# Patient Record
Sex: Female | Born: 1945 | Race: White | Hispanic: No | Marital: Married | Smoking: Never smoker
Health system: Southern US, Community
[De-identification: ages and names within clinical notes are randomized; demographics above are authoritative.]

## PROBLEM LIST (undated history)

## (undated) DIAGNOSIS — M797 Fibromyalgia: Secondary | ICD-10-CM

## (undated) DIAGNOSIS — I7300 Raynaud's syndrome without gangrene: Secondary | ICD-10-CM

## (undated) DIAGNOSIS — M4696 Unspecified inflammatory spondylopathy, lumbar region: Secondary | ICD-10-CM

## (undated) DIAGNOSIS — F419 Anxiety disorder, unspecified: Secondary | ICD-10-CM

## (undated) DIAGNOSIS — M810 Age-related osteoporosis without current pathological fracture: Secondary | ICD-10-CM

## (undated) DIAGNOSIS — M359 Systemic involvement of connective tissue, unspecified: Secondary | ICD-10-CM

## (undated) DIAGNOSIS — K219 Gastro-esophageal reflux disease without esophagitis: Secondary | ICD-10-CM

## (undated) DIAGNOSIS — Z5189 Encounter for other specified aftercare: Secondary | ICD-10-CM

## (undated) DIAGNOSIS — T7840XA Allergy, unspecified, initial encounter: Secondary | ICD-10-CM

## (undated) DIAGNOSIS — S0300XA Dislocation of jaw, unspecified side, initial encounter: Secondary | ICD-10-CM

## (undated) DIAGNOSIS — M3500 Sicca syndrome, unspecified: Secondary | ICD-10-CM

## (undated) DIAGNOSIS — I10 Essential (primary) hypertension: Secondary | ICD-10-CM

## (undated) DIAGNOSIS — M1990 Unspecified osteoarthritis, unspecified site: Secondary | ICD-10-CM

## (undated) HISTORY — DX: Unspecified inflammatory spondylopathy, lumbar region: M46.96

## (undated) HISTORY — DX: Encounter for other specified aftercare: Z51.89

## (undated) HISTORY — PX: CARPAL TUNNEL RELEASE: SHX101

## (undated) HISTORY — PX: BURCH PROCEDURE: SHX1273

## (undated) HISTORY — DX: Sjogren syndrome, unspecified: M35.00

## (undated) HISTORY — DX: Unspecified osteoarthritis, unspecified site: M19.90

## (undated) HISTORY — DX: Gastro-esophageal reflux disease without esophagitis: K21.9

## (undated) HISTORY — DX: Essential (primary) hypertension: I10

## (undated) HISTORY — DX: Age-related osteoporosis without current pathological fracture: M81.0

## (undated) HISTORY — PX: BLADDER SURGERY: SHX569

## (undated) HISTORY — PX: JOINT REPLACEMENT: SHX530

## (undated) HISTORY — PX: COLON SURGERY: SHX602

## (undated) HISTORY — DX: Raynaud's syndrome without gangrene: I73.00

## (undated) HISTORY — DX: Systemic involvement of connective tissue, unspecified: M35.9

## (undated) HISTORY — DX: Dislocation of jaw, unspecified side, initial encounter: S03.00XA

## (undated) HISTORY — DX: Anxiety disorder, unspecified: F41.9

## (undated) HISTORY — PX: APPENDECTOMY: SHX54

## (undated) HISTORY — PX: ABDOMINAL HYSTERECTOMY: SHX81

## (undated) HISTORY — DX: Fibromyalgia: M79.7

## (undated) HISTORY — DX: Allergy, unspecified, initial encounter: T78.40XA

## (undated) HISTORY — PX: EYE SURGERY: SHX253

---

## 2016-07-05 HISTORY — PX: BELPHAROPTOSIS REPAIR: SHX369

## 2017-01-07 DIAGNOSIS — M8580 Other specified disorders of bone density and structure, unspecified site: Secondary | ICD-10-CM | POA: Insufficient documentation

## 2017-06-26 DIAGNOSIS — M3500 Sicca syndrome, unspecified: Secondary | ICD-10-CM | POA: Insufficient documentation

## 2017-06-26 HISTORY — DX: Sjogren syndrome, unspecified: M35.00

## 2017-10-07 DIAGNOSIS — M159 Polyosteoarthritis, unspecified: Secondary | ICD-10-CM

## 2017-10-07 HISTORY — DX: Polyosteoarthritis, unspecified: M15.9

## 2020-09-30 DIAGNOSIS — F5104 Psychophysiologic insomnia: Secondary | ICD-10-CM | POA: Insufficient documentation

## 2021-01-25 DIAGNOSIS — K760 Fatty (change of) liver, not elsewhere classified: Secondary | ICD-10-CM | POA: Insufficient documentation

## 2021-10-04 HISTORY — PX: COLON RESECTION: SHX5231

## 2021-10-10 LAB — HM COLONOSCOPY

## 2022-03-15 ENCOUNTER — Other Ambulatory Visit: Payer: Self-pay

## 2022-03-15 DIAGNOSIS — R059 Cough, unspecified: Secondary | ICD-10-CM | POA: Diagnosis present

## 2022-03-15 DIAGNOSIS — I10 Essential (primary) hypertension: Secondary | ICD-10-CM | POA: Insufficient documentation

## 2022-03-15 DIAGNOSIS — R7401 Elevation of levels of liver transaminase levels: Secondary | ICD-10-CM | POA: Insufficient documentation

## 2022-03-15 DIAGNOSIS — J208 Acute bronchitis due to other specified organisms: Secondary | ICD-10-CM | POA: Insufficient documentation

## 2022-03-15 DIAGNOSIS — R630 Anorexia: Secondary | ICD-10-CM | POA: Insufficient documentation

## 2022-03-15 DIAGNOSIS — E875 Hyperkalemia: Secondary | ICD-10-CM | POA: Insufficient documentation

## 2022-03-15 LAB — COMPREHENSIVE METABOLIC PANEL
ALT: 15 U/L (ref 0–44)
AST: 45 U/L — ABNORMAL HIGH (ref 15–41)
Albumin: 3.8 g/dL (ref 3.5–5.0)
Alkaline Phosphatase: 55 U/L (ref 38–126)
Anion gap: 12 (ref 5–15)
CO2: 21 mmol/L — ABNORMAL LOW (ref 22–32)
Calcium: 9.3 mg/dL (ref 8.9–10.3)
Chloride: 104 mmol/L (ref 98–111)
Creatinine, Ser: UNDETERMINED mg/dL (ref 0.44–1.00)
Glucose, Bld: 117 mg/dL — ABNORMAL HIGH (ref 70–99)
Potassium: 5.5 mmol/L — ABNORMAL HIGH (ref 3.5–5.1)
Sodium: 137 mmol/L (ref 135–145)
Total Bilirubin: 2 mg/dL — ABNORMAL HIGH (ref 0.3–1.2)
Total Protein: 7 g/dL (ref 6.5–8.1)

## 2022-03-15 NOTE — ED Triage Notes (Addendum)
Pt states was sent by doctor for low calcium in her blood work. Pt states has been having back spasms. Pt states was seen at [facility] today and had  blood work for evaluation of bronchitis.  ?

## 2022-03-16 ENCOUNTER — Emergency Department: Payer: Medicare Other

## 2022-03-16 ENCOUNTER — Emergency Department
Admission: EM | Admit: 2022-03-16 | Discharge: 2022-03-16 | Disposition: A | Discharge status: Home / Self Care | Payer: Medicare Other | Attending: Emergency Medicine | Admitting: Emergency Medicine

## 2022-03-16 ENCOUNTER — Encounter: Payer: Self-pay | Admitting: Emergency Medicine

## 2022-03-16 DIAGNOSIS — J208 Acute bronchitis due to other specified organisms: Secondary | ICD-10-CM

## 2022-03-16 LAB — BASIC METABOLIC PANEL
Anion gap: 8 (ref 5–15)
BUN: 20 mg/dL (ref 8–23)
CO2: 27 mmol/L (ref 22–32)
Calcium: 9.6 mg/dL (ref 8.9–10.3)
Chloride: 103 mmol/L (ref 98–111)
Creatinine, Ser: 0.77 mg/dL (ref 0.44–1.00)
GFR, Estimated: >60 mL/min (ref >60)
Glucose, Bld: 89 mg/dL (ref 70–99)
Potassium: 4.1 mmol/L (ref 3.5–5.1)
Sodium: 138 mmol/L (ref 135–145)

## 2022-03-16 LAB — MAGNESIUM: Magnesium: 2.4 mg/dL (ref 1.7–2.4)

## 2022-03-16 MED ORDER — HYDROCOD POLI-CHLORPHE POLI ER 10-8 MG/5ML PO SUER
5.0000 mL | Freq: Once | ORAL | Status: AC
  Administered 2022-03-16: 5 mL via ORAL
  Filled 2022-03-16: qty 5

## 2022-03-16 MED ORDER — HYDROCOD POLI-CHLORPHE POLI ER 10-8 MG/5ML PO SUER: 5.0000 mL | Freq: Two times a day (BID) | ORAL | 0 refills | Status: DC | PRN

## 2022-03-16 NOTE — Discharge Instructions (Addendum)
Please continue taking the medications you are on.  Your cough should improve after a few more days.  Your labs were all very reassuring tonight.  Continue to drink plenty of fluids and stay hydrated as you are getting over your acute illness.  Return to the emergency department if you develop new or worsening symptoms that concern you. ?

## 2022-03-16 NOTE — ED Provider Notes (Signed)
? ?[facility] ?Provider Note ? ? ? Event Date/Time  ? First MD Initiated Contact with Patient 03/16/22 0010   ?  (approximate) ? ? ?History  ? ?low calcium ? ? ?HPI ? ?Dalinda Heidt is a 76 y.o. female with no contributory past medical history who presents for evaluation of abnormal labs.  For several days she has been dealing with increased cough and some shortness of breath particular with exertion.  She had a telemedicine visit with an NP who ordered some labs and started her on a Z-Pak, prednisone, and cough medicine.  However the lab results were notable for a calcium of 8.3.  The provider was concerned because the patient also reported that she has been having some cramps in her back, so combined with the slightly lower than calcium level, she recommended she go to the emergency department for additional evaluation. ? ?The patient is in no distress at this time.  She has a frequent cough but is no worse than she was a couple of days ago when she started the medications.  No recent fever.  No chest pain.  No nausea nor vomiting.  Slightly decreased oral intake compared to normal. ?  ? ? ?Physical Exam  ? ?Triage Vital Signs: ?ED Triage Vitals [03/15/22 2147]  ?Enc Vitals Group  ?   BP (!) 147/83  ?   Pulse Rate 74  ?   Resp 16  ?   Temp 98 ?F (36.7 ?C)  ?   Temp Source Oral  ?   SpO2 93 %  ?   Weight 63.5 kg (140 lb)  ?   Height 1.549 m ('5\' 1"'$ )  ?   Head Circumference   ?   Peak Flow   ?   Pain Score 0  ?   Pain Loc   ?   Pain Edu?   ?   Excl. in [place]?   ? ? ?Most recent vital signs: ?Vitals:  ? 03/15/22 2147 03/16/22 0100  ?BP: (!) 147/83 (!) 158/76  ?Pulse: 74 (!) 59  ?Resp: 16 16  ?Temp: 98 ?F (36.7 ?C)   ?SpO2: 93% 98%  ? ? ? ?General: Awake, no distress.  Good spirits, alert and oriented x3, no acute distress. ?CV:  Good peripheral perfusion.  Normal heart sounds. ?Resp:  Normal effort.  Lungs are clear to auscultation though she does have occasional tight sounding cough.  No coarse  breath sounds in the lung bases. ?Abd:  No distention.  ? ? ?ED Results / Procedures / Treatments  ? ?Labs ?(all labs ordered are listed, but only abnormal results are displayed) ?Labs Reviewed  ?COMPREHENSIVE METABOLIC PANEL - Abnormal; Notable for the following components:  ?    Result Value  ? Potassium 5.5 (*)   ? CO2 21 (*)   ? Glucose, Bld 117 (*)   ? AST 45 (*)   ? Total Bilirubin 2.0 (*)   ? All other components within normal limits  ?BASIC METABOLIC PANEL  ?MAGNESIUM  ? ? ? ? ? ?RADIOLOGY ?I personally reviewed and interpreted the patient's two-view chest x-ray.  No evidence of pneumonia nor other acute abnormality. ? ? ? ?PROCEDURES: ? ?Critical Care performed: No ? ?Procedures ? ? ?MEDICATIONS ORDERED IN ED: ?Medications  ?chlorpheniramine-HYDROcodone 10-8 MG/5ML suspension 5 mL (5 mLs Oral Given 03/16/22 0101)  ? ? ? ?IMPRESSION / MDM / ASSESSMENT AND PLAN / ED COURSE  ?I reviewed the triage vital signs and the nursing notes. ?             ?               ? ?  Differential diagnosis includes, but is not limited to, metabolic or electrolyte abnormality, symptomatic hypocalcemia, mild dehydration, community-acquired pneumonia, viral bronchitis. ? ?Vital signs interpreted and are reassuring other than very mild hypertension.  Physical exam is reassuring; other than frequent cough she has no concerning abnormalities.  She has a printout with her outpatient lab work and I reviewed and interpreted the results.  The low counts and that the outpatient provider was worried about was a calcium of 8.3.  This is unlikely to result in muscle cramps and unlikely to require IV treatment. ? ?However labs ordered during this visit include a CMP.  I interpreted the results and her potassium was 5.5 but I suspect that the labs may not be accurate because there was an insufficient quantity to calculated BUN and creatinine.  Her AST was slightly elevated at 45 and her total bilirubin is slightly elevated at 2.0, but she is  having no abdominal pain or tenderness nor other GI symptoms. ? ?I talked with the patient, who is in no distress, and we agreed that I would order a two-view chest x-ray and have an IV placed to order another metabolic panel, as well as a magnesium level.  This should allow me to better assess the patient's electrolyte or metabolic abnormalities at this time, but anticipate she will be treatable as an outpatient.  She understands and agrees with the plan. ? ? ? ? ?Clinical Course as of 03/16/22 0202  ?Sat Mar 16, 2022  ?6967 Basic metabolic panel ?I interpreted the results of the repeat BMP, and they are all within normal limits including her calcium and her potassium.  I personally viewed and interpreted her two-view chest x-ray and I see no evidence of pneumonia. ? ?I updated the patient with the results and she is very pleased.  She agrees with the plan for discharge and outpatient follow-up as needed.  I gave my usual and customary return precautions.  There is no evidence she would benefit from inpatient hospitalization. [CF]  ?  ?Clinical Course User Index ?[CF] Hinda Kehr, MD  ? ? ? ?FINAL CLINICAL IMPRESSION(S) / ED DIAGNOSES  ? ?Final diagnoses:  ?Viral bronchitis  ? ? ? ?Rx / DC Orders  ? ?ED Discharge Orders   ? ? None  ? ?  ? ? ? ?Note:  This document was prepared using Dragon voice recognition software and may include unintentional dictation errors. ?  ?Hinda Kehr, MD ?03/16/22 0202 ? ?

## 2022-03-27 ENCOUNTER — Ambulatory Visit: Payer: Medicare Other | Admitting: Family Medicine

## 2022-03-27 ENCOUNTER — Encounter: Payer: Self-pay | Admitting: Family Medicine

## 2022-03-27 VITALS — BP 122/63 | HR 77 | Temp 98.6°F | Resp 16 | Ht 61.0 in | Wt 140.6 lb

## 2022-03-27 DIAGNOSIS — J301 Allergic rhinitis due to pollen: Secondary | ICD-10-CM | POA: Diagnosis not present

## 2022-03-27 DIAGNOSIS — R0609 Other forms of dyspnea: Secondary | ICD-10-CM | POA: Diagnosis not present

## 2022-03-27 DIAGNOSIS — J208 Acute bronchitis due to other specified organisms: Secondary | ICD-10-CM | POA: Insufficient documentation

## 2022-03-27 MED ORDER — ALBUTEROL SULFATE HFA 108 (90 BASE) MCG/ACT IN AERS: 2.0000 | INHALATION_SPRAY | Freq: Four times a day (QID) | RESPIRATORY_TRACT | 11 refills | Status: DC | PRN

## 2022-03-27 MED ORDER — OPTICHAMBER DIAMOND MISC: 0 refills | Status: DC

## 2022-03-27 MED ORDER — CETIRIZINE HCL 10 MG PO TABS: 10.0000 mg | ORAL_TABLET | Freq: Every day | ORAL | 11 refills | Status: DC

## 2022-03-27 NOTE — Progress Notes (Signed)
I,Sulibeya S Dimas,acting as a Education administrator for Gwyneth Sprout, FNP.,have documented all relevant documentation on the behalf of Gwyneth Sprout, FNP,as directed by  Gwyneth Sprout, FNP while in the presence of Gwyneth Sprout, FNP.  New patient visit   Patient: Grace Wheeler   DOB: [birth year]   76 y.o. Female  MRN: [id number] Visit Date: 03/27/2022  Today's healthcare provider: Gwyneth Sprout, FNP  Patient presents for new patient visit to establish care.  Introduced to Designer, jewellery role and practice setting.  All questions answered.  Discussed provider/patient relationship and expectations.   Chief Complaint  Patient presents with   New Patient (Initial Visit)   Subjective    Grace Wheeler is a 76 y.o. female who presents today as a new patient to establish care.  HPI  Patient recently moved from [place]. Patient reports she is wanting to go over her recent visit from 03/16/22. Patient was sent to ER due to abnormal lab results and URI. Patient reports persistent cough and some shortness of breath.  Past Medical History:  Diagnosis Date   Arthritis    Autoimmune disease ([place])    multiple autoimmune system problems   Blood transfusion without reported diagnosis    Fibromyalgia    GERD (gastroesophageal reflux disease)    Hypertension    Lumbar spondylitis (HCC)    Raynaud's disease    Sjogren's disease ([place])    TMJ (dislocation of temporomandibular joint)    Past Surgical History:  Procedure Laterality Date   ABDOMINAL HYSTERECTOMY     BLADDER SURGERY     BURCH PROCEDURE     CARPAL TUNNEL RELEASE Bilateral    No family status information on file.   History reviewed. No pertinent family history. Social History   Socioeconomic History   Marital status: Married    Spouse name: Not on file   Number of children: Not on file   Years of education: Not on file   Highest education level: Not on file  Occupational History   Not on file  Tobacco Use   Smoking status: Never    Smokeless tobacco: Never  Substance and Sexual Activity   Alcohol use: Not on file   Drug use: Not on file   Sexual activity: Not on file  Other Topics Concern   Not on file  Social History Narrative   Not on file   Social Determinants of Health   Financial Resource Strain: Not on file  Food Insecurity: Not on file  Transportation Needs: Not on file  Physical Activity: Not on file  Stress: Not on file  Social Connections: Not on file   Outpatient Medications Prior to Visit  Medication Sig   acetaminophen (TYLENOL) 650 MG CR tablet Tylenol Arthritis Pain 650 mg tablet,extended release  Take 1 tablet twice a day by oral route.   clonazePAM (KLONOPIN) 1 MG tablet clonazepam 1 mg tablet  Take 1 tablet every day by oral route at bedtime.   diclofenac Sodium (VOLTAREN) 1 % GEL diclofenac 1 % topical gel  APPLY 2 GRAM TO THE AFFECTED AREA(S) BY TOPICAL ROUTE 4 TIMES PER DAY   hydrochlorothiazide (HYDRODIURIL) 25 MG tablet hydrochlorothiazide 25 mg tablet  TAKE 1 TABLET BY MOUTH  DAILY   hydroxychloroquine (PLAQUENIL) 200 MG tablet hydroxychloroquine 200 mg tablet  TAKE 1 TABLET BY MOUTH  DAILY   Light Mineral Oil-Mineral Oil 0.5-0.5 % EMUL Retaine MGD (PF) 0.5 %-0.5 % eye drops in a dropperette  As Needed  Loteprednol Etabonate 0.5 % GEL Lotemax 0.5 % eye gel drops  Instill 1 drop every day by ophthalmic route.   metoprolol succinate (TOPROL-XL) 50 MG 24 hr tablet metoprolol succinate ER 50 mg tablet,extended release 24 hr  TAKE 1 TABLET BY MOUTH AT  BEDTIME   omeprazole (PRILOSEC) 40 MG capsule omeprazole 40 mg capsule,delayed release  TAKE 1 CAPSULE BY MOUTH  DAILY   chlorpheniramine-HYDROcodone (TUSSIONEX PENNKINETIC ER) 10-8 MG/5ML Take 5 mLs by mouth every 12 (twelve) hours as needed for cough. (Patient not taking: Reported on 03/27/2022)   No facility-administered medications prior to visit.   Allergies  Allergen Reactions   Ambien [Zolpidem]     Other reaction(s): Other    Levofloxacin     Other reaction(s): Vomiting     There is no immunization history on file for this patient.  Health Maintenance  Topic Date Due   Hepatitis C Screening  Never done   TETANUS/TDAP  Never done   COLONOSCOPY (Pts 45-69yr Insurance coverage will need to be confirmed)  Never done   MAMMOGRAM  Never done   Zoster Vaccines- Shingrix (1 of 2) Never done   Pneumonia Vaccine 74 Years old (1 - PCV) Never done   DEXA SCAN  Never done   COVID-19 Vaccine (3 - Booster for Moderna series) 03/07/2020   INFLUENZA VACCINE  06/04/2022   HPV VACCINES  Aged Out    Patient Care Team: PGwyneth Sprout FNP as PCP - General (Family Medicine)  Review of Systems  Constitutional:  Positive for diaphoresis, fatigue and fever.  HENT:  Positive for congestion and rhinorrhea.   Respiratory:  Positive for cough and wheezing.   Musculoskeletal:  Positive for arthralgias.      Objective    BP 122/63 (BP Location: Left Arm, Patient Position: Sitting, Cuff Size: Normal)   Pulse 77   Temp 98.6 F (37 C) (Oral)   Resp 16   Ht '5\' 1"'$  (1.549 m)   Wt 140 lb 9.6 oz (63.8 kg)   SpO2 99%   BMI 26.57 kg/m    Physical Exam Vitals and nursing note reviewed.  Constitutional:      General: She is not in acute distress.    Appearance: Normal appearance. She is overweight. She is not ill-appearing, toxic-appearing or diaphoretic.  HENT:     Head: Normocephalic and atraumatic.     Right Ear: Tympanic membrane, ear canal and external ear normal. Decreased hearing noted.     Left Ear: Tympanic membrane, ear canal and external ear normal. Decreased hearing noted.     Nose: Nose normal. No congestion or rhinorrhea.     Right Sinus: No maxillary sinus tenderness or frontal sinus tenderness.     Left Sinus: No maxillary sinus tenderness or frontal sinus tenderness.     Mouth/Throat:     Mouth: Mucous membranes are moist.     Pharynx: Oropharynx is clear. No oropharyngeal exudate.  Eyes:      Pupils: Pupils are equal, round, and reactive to light.  Cardiovascular:     Rate and Rhythm: Normal rate and regular rhythm.     Pulses: Normal pulses.     Heart sounds: Normal heart sounds. No murmur heard.   No friction rub. No gallop.  Pulmonary:     Effort: Pulmonary effort is normal. No respiratory distress.     Breath sounds: Normal breath sounds. No stridor. No wheezing, rhonchi or rales.  Chest:     Chest wall: No tenderness.  Abdominal:     General: Bowel sounds are normal.     Palpations: Abdomen is soft.  Musculoskeletal:        General: No swelling, tenderness, deformity or signs of injury. Normal range of motion.     Right lower leg: No edema.     Left lower leg: No edema.  Skin:    General: Skin is warm and dry.     Capillary Refill: Capillary refill takes less than 2 seconds.     Coloration: Skin is not jaundiced or pale.     Findings: No bruising, erythema, lesion or rash.  Neurological:     General: No focal deficit present.     Mental Status: She is alert and oriented to person, place, and time. Mental status is at baseline.     Cranial Nerves: No cranial nerve deficit.     Sensory: No sensory deficit.     Motor: No weakness.     Coordination: Coordination normal.  Psychiatric:        Mood and Affect: Mood normal.        Behavior: Behavior normal.        Thought Content: Thought content normal.        Judgment: Judgment normal.    Depression Screen    03/27/2022    3:13 PM  PHQ 2/9 Scores  PHQ - 2 Score 0  PHQ- 9 Score 2   No results found for any visits on 03/27/22.  Assessment & Plan      Problem List Items Addressed This Visit       Respiratory   Seasonal allergic rhinitis due to pollen    Acute, stable Recommend use of zyrtec daily at qHS to assist with new relocation to [place] Denies previous hx of seasonal allergies        Relevant Medications   cetirizine (ZYRTEC) 10 MG tablet     Other   DOE (dyspnea on exertion) - Primary     Recent hospitalization d/t viral bronchitis, continues to have occasional wheezing Lungs clear today; no rhonchi heard Denies cough Continues to not feel 100% Recommend use of inhaler for DOE  Continue to allow time for recovery        Relevant Medications   albuterol (VENTOLIN HFA) 108 (90 Base) MCG/ACT inhaler   Spacer/Aero-Holding Chambers (OPTICHAMBER DIAMOND) MISC     Return in about 3 months (around 06/27/2022) for chonic disease management- check her records at home from [facility].     I, Gwyneth Sprout, FNP, have reviewed all documentation for this visit. The documentation on 03/27/22 for the exam, diagnosis, procedures, and orders are all accurate and complete.    Gwyneth Sprout, [place] [phone] (phone) [phone] (fax)  [place]

## 2022-03-27 NOTE — Assessment & Plan Note (Signed)
Acute, stable Recommend use of zyrtec daily at qHS to assist with new relocation to [place] Denies previous hx of seasonal allergies

## 2022-03-27 NOTE — Assessment & Plan Note (Signed)
Recent hospitalization d/t viral bronchitis, continues to have occasional wheezing Lungs clear today; no rhonchi heard Denies cough Continues to not feel 100% Recommend use of inhaler for DOE  Continue to allow time for recovery

## 2022-05-27 ENCOUNTER — Telehealth: Payer: Self-pay | Admitting: Family Medicine

## 2022-05-27 NOTE — Telephone Encounter (Signed)
Copied from [place] [id number]. Topic: Medicare AWV >> May 27, 2022  3:19 PM Jae Dire wrote: Reason for CRM:  Left message for patient to call back and schedule Medicare Annual Wellness Visit (AWV) in office.   If not able to come in the office, please offer to do virtually or by telephone.   No hx of AWV - AWV-I eligible per [employer] as of 03/04/2014  Please schedule at anytime with [facility] Health Advisor.   45 minute appointment  Any questions, please contact me at [phone]

## 2022-06-14 ENCOUNTER — Telehealth: Payer: Self-pay | Admitting: Family Medicine

## 2022-06-14 DIAGNOSIS — M069 Rheumatoid arthritis, unspecified: Secondary | ICD-10-CM

## 2022-06-14 DIAGNOSIS — D229 Melanocytic nevi, unspecified: Secondary | ICD-10-CM

## 2022-06-14 NOTE — Telephone Encounter (Signed)
Patient would like to speak with someone about getting referred if necessary to other specialist with in [facility]

## 2022-06-14 NOTE — Telephone Encounter (Signed)
Spoke with patient. States she does not need a referral per her insurance, but wanted to know of rheumatologists within [facility] that she could be seen by and accepted her insurance. Phone number to an office was given to her and she said she will call them and look into it from there.

## 2022-06-17 ENCOUNTER — Other Ambulatory Visit: Payer: Self-pay | Admitting: Family Medicine

## 2022-06-17 NOTE — Telephone Encounter (Signed)
[facility] faxed refill request for the following medications:  clonazePAM (KLONOPIN) 1 MG tablet   Please advise.

## 2022-06-19 ENCOUNTER — Telehealth: Payer: Self-pay

## 2022-06-19 NOTE — Telephone Encounter (Signed)
Referrals sent

## 2022-06-19 NOTE — Telephone Encounter (Signed)
Copied from [place] [id number]. Topic: Referral - Request for Referral >> Jun 19, 2022 10:54 AM Tiffany B wrote: Patient states she has always seen a Dermatologist and now that she has relocated she would like PCP to refer her to a dermatologist due to her mother passing away from melanoma cancer, patient has several moles on her body and annual checks.   Patient would like  a referral to a Rheumatologist due to fibromyalgia, arthritis and her previous specialist gave her her frequent cortisone injection.  Patient would like a follow up call when referrals have been placed

## 2022-06-26 ENCOUNTER — Telehealth: Payer: Self-pay

## 2022-06-26 NOTE — Telephone Encounter (Signed)
Copied from [place] [id number]. Topic: General - Other >> Jun 26, 2022  9:09 AM Cyndi Bender wrote: Reason for CRM: Pt reports that she saw 2 Rheumatologist and provided the following: Dr. Darcey Nora at [facility] ph# [phone] and Dr. Joanne Chars at [facility] [place] ph# [phone]

## 2022-07-01 ENCOUNTER — Telehealth: Payer: Self-pay

## 2022-07-01 ENCOUNTER — Other Ambulatory Visit: Payer: Self-pay | Admitting: Family Medicine

## 2022-07-01 NOTE — Telephone Encounter (Signed)
Called patient to explain that Daneil Dan did not prescribe klonopin to her, it came from another prescriber. Not sure what is needed from [place]. Okay for [facility] to relay message.

## 2022-07-01 NOTE — Telephone Encounter (Addendum)
Patient established care with PCP on 03/27/2022 and stated all her records should have been received. Patient is unclear why PCP can not renew her medications.   Patient states she brought in a list of long term medication. Patient states if another appointment is needed to refill medication please advise because she will run out of her  clonazePAM (KLONOPIN) 1 MG tablet.    [facility] ([place] [place] Phone:  [phone]  Fax:  [phone]

## 2022-07-01 NOTE — Telephone Encounter (Signed)
error 

## 2022-07-01 NOTE — Telephone Encounter (Unsigned)
Copied from [place] [id number]. Topic: General - Other >> Jul 01, 2022  9:27 AM Everette C wrote: Reason for CRM: Medication Refill - Medication: omeprazole (PRILOSEC) 40 MG capsule [[id number]] - the patient has requested a 90 day supply   Has the patient contacted their pharmacy? Yes.   (Agent: If no, request that the patient contact the pharmacy for the refill. If patient does not wish to contact the pharmacy document the reason why and proceed with request.) (Agent: If yes, when and what did the pharmacy advise?)  Preferred Pharmacy (with phone number or street name): [facility] ([place], [facility] [address] [place] [address] [place] [address] Phone: [phone] Fax: [phone] Hours: Not open 24 hours   Has the patient been seen for an appointment in the last year OR does the patient have an upcoming appointment? Yes.    Agent: Please be advised that RX refills may take up to 3 business days. We ask that you follow-up with your pharmacy.

## 2022-07-01 NOTE — Telephone Encounter (Addendum)
I scheduled this patient for tomorrow for a medication refill as she is upset she will be out of medication. She was demanding we send in her medication today to [facility] for clonazePAM (KLONOPIN) 1 MG tablet. She stated she didn't understand why we were not able to fill today. Explained to her Daneil Dan is out of the office and would be happy to make an appt on Wednesday. She declined and got very upset stating we didn't have any providers that could give her the medication she needs. I told her that is not what I said and they it is to each provider as to what they feel comfortable prescribing. She then demanded to see an MD tomorrow - so I scheduled her with Dr. Caryn Section and told her he WOULD NOT be taking over her care - and also has the right to decline the refill as we only have one office note from the previous PCP.   Pt stated "what in the hell is wrong with your office" and then be came very agitated.  She stated I called her a drug addict and if she was she would be asking for something stronger.  I told her I never called her that and we are only trying to do what is best for the patient and her health.   Appt was made.

## 2022-07-01 NOTE — Telephone Encounter (Signed)
Copied from [place] [id number]. Topic: General - Other >> Jul 01, 2022  9:26 AM Everette C wrote: Reason for CRM: The patient has been directed by pharmacy to provide their PCP with a phone number to follow up on a previous request for clonazePAM (KLONOPIN) 1 MG tablet   The patient would like for Prov. Rollene Rotunda to contact [phone] to provide additional information related to their previous request for the medication   Please contact the patient further if needed

## 2022-07-02 ENCOUNTER — Encounter: Payer: Self-pay | Admitting: Family Medicine

## 2022-07-02 ENCOUNTER — Ambulatory Visit: Payer: Medicare Other | Admitting: Family Medicine

## 2022-07-02 VITALS — BP 127/61 | HR 75 | Temp 99.2°F | Resp 16 | Wt 143.0 lb

## 2022-07-02 DIAGNOSIS — M8580 Other specified disorders of bone density and structure, unspecified site: Secondary | ICD-10-CM | POA: Insufficient documentation

## 2022-07-02 DIAGNOSIS — K219 Gastro-esophageal reflux disease without esophagitis: Secondary | ICD-10-CM | POA: Insufficient documentation

## 2022-07-02 DIAGNOSIS — G4700 Insomnia, unspecified: Secondary | ICD-10-CM | POA: Insufficient documentation

## 2022-07-02 DIAGNOSIS — I10 Essential (primary) hypertension: Secondary | ICD-10-CM | POA: Diagnosis not present

## 2022-07-02 DIAGNOSIS — I700 Atherosclerosis of aorta: Secondary | ICD-10-CM | POA: Insufficient documentation

## 2022-07-02 DIAGNOSIS — M3500 Sicca syndrome, unspecified: Secondary | ICD-10-CM | POA: Diagnosis not present

## 2022-07-02 DIAGNOSIS — I7300 Raynaud's syndrome without gangrene: Secondary | ICD-10-CM | POA: Insufficient documentation

## 2022-07-02 DIAGNOSIS — M797 Fibromyalgia: Secondary | ICD-10-CM | POA: Insufficient documentation

## 2022-07-02 DIAGNOSIS — E559 Vitamin D deficiency, unspecified: Secondary | ICD-10-CM | POA: Insufficient documentation

## 2022-07-02 MED ORDER — CLONAZEPAM 1 MG PO TABS: 1.0000 mg | ORAL_TABLET | Freq: Every day | ORAL | 1 refills | Status: DC

## 2022-07-02 MED ORDER — OMEPRAZOLE 40 MG PO CPDR: 40.0000 mg | DELAYED_RELEASE_CAPSULE | Freq: Every day | ORAL | 1 refills | Status: DC

## 2022-07-02 NOTE — Progress Notes (Signed)
I,Roshena L Chambers,acting as a scribe for Lelon Huh, MD.,have documented all relevant documentation on the behalf of Lelon Huh, MD,as directed by  Lelon Huh, MD while in the presence of Lelon Huh, MD.    Established patient visit   Patient: Grace Wheeler   DOB: 17-Jun-1946   76 y.o. Female  MRN: [id number] Visit Date: 07/02/2022  Today's healthcare provider: Lelon Huh, MD   Chief Complaint  Patient presents with   Insomnia   Gastroesophageal Reflux   Subjective    HPI  Insomnia: Patient is here today requesting a refill on Klonopin. She reports taking Klonopin for the past 3 years. This medication was previously prescribed by her former health care provider in [place], Dr. Gus Puma. Patient states she has fibromyalgia which causes her to have trouble falling asleep and staying asleep.  Patient established care with Tally Joe at the end of May and was advised to follow up for chronic problems in 3 months. Request for refill of clonazepam was faxed from [facility] on 06-17-2022 which was denied, but patient was never contacted about denial. She just found out over the weekend that refill was denied and upset that she was never contacted about denial.   GERD: Patient is requesting a refill for Omeprazole. She reports taking this medication to help with indigestion for many years and reports it continues to work well.   Medications: Outpatient Medications Prior to Visit  Medication Sig   acetaminophen (TYLENOL) 650 MG CR tablet Tylenol Arthritis Pain 650 mg tablet,extended release  Take 1 tablet twice a day by oral route.   clonazePAM (KLONOPIN) 1 MG tablet clonazepam 1 mg tablet  Take 1 tablet every day by oral route at bedtime.   diclofenac Sodium (VOLTAREN) 1 % GEL diclofenac 1 % topical gel  APPLY 2 GRAM TO THE AFFECTED AREA(S) BY TOPICAL ROUTE 4 TIMES PER DAY   hydrochlorothiazide (HYDRODIURIL) 25 MG tablet hydrochlorothiazide 25 mg tablet  TAKE 1 TABLET  BY MOUTH  DAILY   hydroxychloroquine (PLAQUENIL) 200 MG tablet hydroxychloroquine 200 mg tablet  TAKE 1 TABLET BY MOUTH  DAILY   Light Mineral Oil-Mineral Oil 0.5-0.5 % EMUL Retaine MGD (PF) 0.5 %-0.5 % eye drops in a dropperette  As Needed   metoprolol succinate (TOPROL-XL) 50 MG 24 hr tablet metoprolol succinate ER 50 mg tablet,extended release 24 hr  TAKE 1 TABLET BY MOUTH AT  BEDTIME   omeprazole (PRILOSEC) 40 MG capsule omeprazole 40 mg capsule,delayed release  TAKE 1 CAPSULE BY MOUTH  DAILY   [DISCONTINUED] albuterol (VENTOLIN HFA) 108 (90 Base) MCG/ACT inhaler Inhale 2 puffs into the lungs every 6 (six) hours as needed for wheezing or shortness of breath.   [DISCONTINUED] cetirizine (ZYRTEC) 10 MG tablet Take 1 tablet (10 mg total) by mouth at bedtime.   [DISCONTINUED] chlorpheniramine-HYDROcodone (TUSSIONEX PENNKINETIC ER) 10-8 MG/5ML Take 5 mLs by mouth every 12 (twelve) hours as needed for cough. (Patient not taking: Reported on 03/27/2022)   [DISCONTINUED] Loteprednol Etabonate 0.5 % GEL Lotemax 0.5 % eye gel drops  Instill 1 drop every day by ophthalmic route. (Patient not taking: Reported on 07/02/2022)   [DISCONTINUED] Spacer/Aero-Holding [facility] DIAMOND) MISC Use with inhaler.   No facility-administered medications prior to visit.    Review of Systems  Constitutional:  Negative for appetite change, chills, fatigue and fever.  Respiratory:  Negative for chest tightness and shortness of breath.   Cardiovascular:  Negative for chest pain and palpitations.  Gastrointestinal:  Negative  for abdominal pain, nausea and vomiting.       Indigestion  Neurological:  Negative for dizziness and weakness.  Psychiatric/Behavioral:  Positive for sleep disturbance.        Objective    BP 127/61 (BP Location: Right Arm, Patient Position: Sitting, Cuff Size: Normal)   Pulse 75   Temp 99.2 F (37.3 C) (Oral)   Resp 16   Wt 143 lb (64.9 kg)   SpO2 98% Comment: room air   BMI 27.02 kg/m    Physical Exam   General appearance: Well developed, well nourished female, cooperative and in no acute distress Head: Normocephalic, without obvious abnormality, atraumatic Respiratory: Respirations even and unlabored, normal respiratory rate Extremities: All extremities are intact.  Skin: Skin color, texture, turgor normal. No rashes seen  Psych: Appropriate mood and affect. Neurologic: Mental status: Alert, oriented to person, place, and time, thought content appropriate.    Assessment & Plan     1. Insomnia, unspecified type Reviewed prior PCP records verifying prescription for clonazepam '1mg'$  [facility] for insomnia. Reviewed PDMP verifying last refill for '1mg'$  clonazepam dispensed #90 from [facility] on 02/18/2022. She has been taking this for about 3 years and gets a good sleep through half the night. Has had adverse reactions to zolpidem and clonazepam in the past. She exhibits no signs of abuse or cognitive impairments. Counseled patient regarding abuse potential and that medication is controlled substance. Refilled #90 and 1 refill to her mail order  2. Gastroesophageal reflux disease, unspecified whether esophagitis present Controlled on omeprazole, refill #90 with 1 refill.   3. Primary hypertension Well controlled. Continue current medications.    4. Sjogren's syndrome, with unspecified organ involvement ([place])  5. Fibromyalgia  6. Aortic atherosclerosis ([place])  7. Osteopenia, unspecified location  8. Vitamin D deficiency  9. Raynaud's disease without gangrene  She requests to be have MD rather the N.P. as her PCP due to multiple complex medical conditions. Will have her follow up with Dr. Alba Cory in 3-4 months.      The entirety of the information documented in the History of Present Illness, Review of Systems and Physical Exam were personally obtained by me. Portions of this information were initially documented by the CMA and reviewed by me for  thoroughness and accuracy.     Lelon Huh, MD  [facility] [phone] (phone) [phone] (fax)  [place]

## 2022-07-02 NOTE — Telephone Encounter (Signed)
Requested medication (s) are due for refill today: unsure  Requested medication (s) are on the active medication list:yes  Last refill:  02/06/18  Future visit scheduled:yes  Notes to clinic:  Unable to refill per protocol, last refill by historical provider.  Last refill 02/06/18, unsure if pt is taking, routing for review.     Requested Prescriptions  Pending Prescriptions Disp Refills   omeprazole (PRILOSEC) 40 MG capsule       Gastroenterology: Proton Pump Inhibitors Passed - 07/01/2022  2:48 PM      Passed - Valid encounter within last 12 months    Recent Outpatient Visits           Today    [facility] Birdie Sons, MD   3 months ago DOE (dyspnea on exertion)   [facility] Gwyneth Sprout, FNP       Future Appointments             In 7 months Brendolyn Patty, MD [place]

## 2022-08-02 ENCOUNTER — Encounter: Payer: Self-pay | Admitting: Family Medicine

## 2022-08-02 ENCOUNTER — Ambulatory Visit: Payer: Medicare Other | Admitting: Family Medicine

## 2022-08-02 VITALS — BP 128/72 | HR 73 | Temp 98.1°F | Resp 16 | Ht 61.0 in | Wt 147.2 lb

## 2022-08-02 DIAGNOSIS — E785 Hyperlipidemia, unspecified: Secondary | ICD-10-CM

## 2022-08-02 DIAGNOSIS — G4700 Insomnia, unspecified: Secondary | ICD-10-CM

## 2022-08-02 DIAGNOSIS — Z7689 Persons encountering health services in other specified circumstances: Secondary | ICD-10-CM | POA: Insufficient documentation

## 2022-08-02 MED ORDER — ROSUVASTATIN CALCIUM 10 MG PO TABS: 10.0000 mg | ORAL_TABLET | Freq: Every day | ORAL | 1 refills | Status: DC

## 2022-08-02 NOTE — Progress Notes (Signed)
Established patient visit  I,[name],acting as a scribe for [employer], MD.,have documented all relevant documentation on the behalf of Eulis Foster, MD,as directed by  Eulis Foster, MD while in the presence of Eulis Foster, MD.   Patient: Grace Wheeler   DOB: 05/09/1946   76 y.o. Female  MRN: [id number] Visit Date: 08/02/2022  Today's healthcare provider: Eulis Foster, MD   Chief Complaint  Patient presents with   Follow-up   Subjective    Patient is here to meet provider and has records from [facility]. She also need a refill on her cholesterol medication.  Lipid/Cholesterol, Follow-up  Last lipid panel Other pertinent labs  No results found for: "CHOL", "HDL", "LDLCALC", "LDLDIRECT", "TRIG", "CHOLHDL" Lab Results  Component Value Date   ALT 15 03/15/2022   AST 45 (H) 03/15/2022     Patient is on Rosuvastatin 10 mg.  She reports excellent compliance with treatment. She is not having side effects.   Symptoms: No chest pain No chest pressure/discomfort  No dyspnea No lower extremity edema  Yes-numbness or tingling of extremity No orthopnea  No palpitations No paroxysmal nocturnal dyspnea  No speech difficulty No syncope   Current diet:  regular Current exercise: none  The ASCVD Risk score (Arnett DK, et al., 2019) failed to calculate for the following reasons:   Cannot find a previous HDL lab   Cannot find a previous total cholesterol lab  Insomnia  Patient reports that she has been taking Klonopin to help with sleeping. She states that she prefers to not be on a controlled substance but has not been able to find any medications that helps with her sleep  She is asking if there are any other options as her current medication only allows for her to sleep about 4 hours per night  She has listed adverse reactions to Ambien and trazodone and states she has tried melatonin and creating optimal sleep  environment including cool room with appropriate lighting  She also reports having a delay in her klonopin refill recently and wants to make sure as her new PCP, I am aware of her history    Encounter to meet new physician  Patient presents to meet new PCP  She presents with a document summarizing her medical history, current medications, previous physicians and  surgical history   ---------------------------------------------------------------------------------------------------   Medications: Outpatient Medications Prior to Visit  Medication Sig   acetaminophen (TYLENOL) 650 MG CR tablet Tylenol Arthritis Pain 650 mg tablet,extended release  Take 1 tablet twice a day by oral route.   clonazePAM (KLONOPIN) 1 MG tablet Take 1 tablet (1 mg total) by mouth at bedtime.   diclofenac Sodium (VOLTAREN) 1 % GEL diclofenac 1 % topical gel  APPLY 2 GRAM TO THE AFFECTED AREA(S) BY TOPICAL ROUTE 4 TIMES PER DAY   hydrochlorothiazide (HYDRODIURIL) 25 MG tablet hydrochlorothiazide 25 mg tablet  TAKE 1 TABLET BY MOUTH  DAILY   hydroxychloroquine (PLAQUENIL) 200 MG tablet hydroxychloroquine 200 mg tablet  TAKE 1 TABLET BY MOUTH  DAILY   Light Mineral Oil-Mineral Oil 0.5-0.5 % EMUL Retaine MGD (PF) 0.5 %-0.5 % eye drops in a dropperette  As Needed   metoprolol succinate (TOPROL-XL) 50 MG 24 hr tablet metoprolol succinate ER 50 mg tablet,extended release 24 hr  TAKE 1 TABLET BY MOUTH AT  BEDTIME   omeprazole (PRILOSEC) 40 MG capsule Take 1 capsule (40 mg total) by mouth daily.   [DISCONTINUED] rosuvastatin (CRESTOR) 10 MG tablet Take  10 mg by mouth daily.   No facility-administered medications prior to visit.    Review of Systems     Objective    BP 128/72 (BP Location: Left Arm, Patient Position: Sitting, Cuff Size: Large)   Pulse 73   Temp 98.1 F (36.7 C) (Oral)   Resp 16   Ht '5\' 1"'$  (1.549 m)   Wt 147 lb 3.2 oz (66.8 kg)   BMI 27.81 kg/m    Physical Exam Vitals reviewed.   Constitutional:      General: She is not in acute distress.    Appearance: Normal appearance. She is not ill-appearing, toxic-appearing or diaphoretic.  HENT:     Head: Normocephalic and atraumatic.     Right Ear: External ear normal.     Left Ear: External ear normal.     Nose: Nose normal.     Mouth/Throat:     Pharynx: Oropharynx is clear.  Eyes:     General: No scleral icterus.    Conjunctiva/sclera: Conjunctivae normal.  Cardiovascular:     Rate and Rhythm: Normal rate and regular rhythm.     Pulses: Normal pulses.     Heart sounds: Normal heart sounds. No murmur heard.    No friction rub. No gallop.  Pulmonary:     Effort: Pulmonary effort is normal. No respiratory distress.     Breath sounds: Normal breath sounds. No wheezing, rhonchi or rales.  Abdominal:     General: Bowel sounds are normal. There is no distension.     Palpations: Abdomen is soft. There is no mass.     Tenderness: There is no abdominal tenderness. There is no guarding.  Musculoskeletal:        General: No deformity.     Cervical back: Normal range of motion and neck supple. No rigidity.     Right lower leg: No edema.     Left lower leg: No edema.  Lymphadenopathy:     Cervical: No cervical adenopathy.  Skin:    General: Skin is warm.     Capillary Refill: Capillary refill takes less than 2 seconds.     Findings: No erythema or rash.  Neurological:     General: No focal deficit present.     Mental Status: She is alert and oriented to person, place, and time.  Psychiatric:        Mood and Affect: Mood normal.        Behavior: Behavior normal.       No results found for any visits on 08/02/22.  Assessment & Plan     Problem List Items Addressed This Visit       Other   Insomnia - Primary    Chronic, stable  She recently received refills for klonopin '1mg'$  daily at bedtime  She will continue this regimen and voices understanding of longterm benzodiazepines risks  We discussed other  options to help with insomnia and she has previously tried most of them  We discussed sleep hygiene and CBT for insomnia        Establishing care with new doctor, encounter for    Reviewed patient's hx and documents that she has in her personal health files       Dyslipidemia    Chronic No record of previous cholesterol levels is available  Will plan to check this at her next physical  She will continue current medication including rosuvastatin '10mg'$  daily  Refills provided       Relevant Medications  rosuvastatin (CRESTOR) 10 MG tablet     Scheduled for follow up in Jan 2024      I, Eulis Foster, MD, have reviewed all documentation for this visit. The documentation on 08/04/22 for the exam, diagnosis, procedures, and orders are all accurate and complete.    Eulis Foster, MD  [facility] [phone] (phone) [phone] (fax)  [place]

## 2022-08-04 DIAGNOSIS — E785 Hyperlipidemia, unspecified: Secondary | ICD-10-CM | POA: Insufficient documentation

## 2022-08-04 NOTE — Assessment & Plan Note (Signed)
Reviewed patient's hx and documents that she has in her personal health files

## 2022-08-04 NOTE — Assessment & Plan Note (Signed)
Chronic No record of previous cholesterol levels is available  Will plan to check this at her next physical  She will continue current medication including rosuvastatin '10mg'$  daily  Refills provided

## 2022-08-04 NOTE — Assessment & Plan Note (Signed)
Chronic, stable  She recently received refills for klonopin '1mg'$  daily at bedtime  She will continue this regimen and voices understanding of longterm benzodiazepines risks  We discussed other options to help with insomnia and she has previously tried most of them  We discussed sleep hygiene and CBT for insomnia

## 2022-08-06 DIAGNOSIS — M47816 Spondylosis without myelopathy or radiculopathy, lumbar region: Secondary | ICD-10-CM | POA: Insufficient documentation

## 2022-08-06 DIAGNOSIS — Z7960 Long term (current) use of unspecified immunomodulators and immunosuppressants: Secondary | ICD-10-CM | POA: Insufficient documentation

## 2022-08-06 HISTORY — DX: Long term (current) use of unspecified immunomodulators and immunosuppressants: Z79.60

## 2022-08-06 HISTORY — DX: Spondylosis without myelopathy or radiculopathy, lumbar region: M47.816

## 2022-08-12 ENCOUNTER — Telehealth: Payer: Self-pay

## 2022-08-12 DIAGNOSIS — R399 Unspecified symptoms and signs involving the genitourinary system: Secondary | ICD-10-CM

## 2022-08-12 NOTE — Telephone Encounter (Signed)
Copied from [place] [phone]. Topic: Referral - Request for Referral >> Aug 12, 2022  2:43 PM Everette C wrote: Has patient seen PCP for this complaint? No. *If NO, is insurance requiring patient see PCP for this issue before PCP can refer them? Referral for which specialty: Urology  Preferred provider/office: [facility] - Dr Erlene Quan  Reason for referral: continued care

## 2022-08-12 NOTE — Addendum Note (Signed)
Addended by: Adolph Pollack on: 08/12/2022 03:53 PM   Modules accepted: Orders

## 2022-08-12 NOTE — Telephone Encounter (Signed)
Please review

## 2022-08-28 ENCOUNTER — Ambulatory Visit: Payer: Medicare Other | Admitting: Urology

## 2022-08-28 ENCOUNTER — Encounter: Payer: Self-pay | Admitting: Urology

## 2022-08-28 VITALS — BP 143/77 | HR 73 | Ht 61.0 in | Wt 149.0 lb

## 2022-08-28 DIAGNOSIS — R399 Unspecified symptoms and signs involving the genitourinary system: Secondary | ICD-10-CM

## 2022-08-28 DIAGNOSIS — R3915 Urgency of urination: Secondary | ICD-10-CM | POA: Diagnosis not present

## 2022-08-28 DIAGNOSIS — R1030 Lower abdominal pain, unspecified: Secondary | ICD-10-CM

## 2022-08-28 DIAGNOSIS — Z87440 Personal history of urinary (tract) infections: Secondary | ICD-10-CM

## 2022-08-28 DIAGNOSIS — N390 Urinary tract infection, site not specified: Secondary | ICD-10-CM

## 2022-08-28 LAB — URINALYSIS, COMPLETE
Bilirubin, UA: NEGATIVE
Glucose, UA: NEGATIVE
Ketones, UA: NEGATIVE
Nitrite, UA: NEGATIVE
Protein,UA: NEGATIVE
Specific Gravity, UA: 1.02 (ref 1.005–1.030)
Urobilinogen, Ur: 0.2 mg/dL (ref 0.2–1.0)
pH, UA: 6.5 (ref 5.0–7.5)

## 2022-08-28 LAB — MICROSCOPIC EXAMINATION

## 2022-08-28 LAB — BLADDER SCAN AMB NON-IMAGING

## 2022-08-28 NOTE — Patient Instructions (Signed)
Consider starting cranberry tablets twice daily, d-mannose and probiotics  If you have UTI symptoms, please call for same of next day appointment

## 2022-08-28 NOTE — Progress Notes (Signed)
08/28/2022 9:45 AM   Grace Wheeler Mar 05, 1946 [id number]  Referring provider: Eulis Foster, MD [address] [place],  [place] [address]  Chief Complaint  Patient presents with   New Patient (Initial Visit)    HPI: 76 year old female referred for further evaluation of urinary tract infections.  She reports that over the past 18 months, she has had 3 urinary tract infections.  2 were associated with perforated diverticulitis for which she was hospitalized 3 times last year in [place].  The first occasion, she had a urinary tract infection associated with her diverticulitis and was managed medically.  On the second hospitalization, she had an abscess as well as a UTI.  Her most recent admission she developed perforated diverticulitis and underwent colectomy.  She did not have a UTI on that occasion.  She has had no issues until recently when she was traveling up [place] for a funeral.  She had developed lower abdominal pain and pressure with some mild urgency.  Last for several days.  Upon returning to [facility], she saw her rheumatologist and mention the symptoms.  She ended up having a urine culture that grew E. coli.  She was treated with Macrobid twice daily for 5 days and her symptoms resolved.  She has a complicated urinary history including a personal history of urethral dilations in the remote past which was complicated by some sort of perforation of her endometriosis requiring hospitalization and hysterectomy.  She also had a Burch procedure several years ago for stress incontinence which seem to have resolved this issue.  She denies any constipation.  She denies any vaginal issues.  She is not sexually active.  Prior to the above, she has had no issues with urinary tract infections in the recent past.   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 If you have any questions, ask your nurse or doctor.          acetaminophen 650 MG CR tablet Commonly known as: TYLENOL Tylenol Arthritis Pain 650 mg tablet,extended release  Take 1 tablet twice a day by oral route.   clonazePAM 1 MG tablet Commonly known as: KLONOPIN Take 1 tablet (1 mg total) by mouth at bedtime.   diclofenac Sodium 1 % Gel Commonly known as: VOLTAREN diclofenac 1 % topical gel  APPLY 2 GRAM TO THE AFFECTED AREA(S) BY TOPICAL ROUTE 4 TIMES PER DAY   hydrochlorothiazide 25 MG tablet Commonly known as: HYDRODIURIL hydrochlorothiazide 25 mg tablet  TAKE  1 TABLET BY MOUTH  DAILY   hydroxychloroquine 200 MG tablet Commonly known as: PLAQUENIL hydroxychloroquine 200 mg tablet  TAKE 1 TABLET BY MOUTH  DAILY   Light Mineral Oil-Mineral Oil 0.5-0.5 % Emul Retaine MGD (PF) 0.5 %-0.5 % eye drops in a dropperette  As Needed   metoprolol succinate 50 MG 24 hr tablet Commonly known as: TOPROL-XL metoprolol succinate ER 50 mg tablet,extended release 24 hr  TAKE 1 TABLET BY MOUTH AT  BEDTIME   omeprazole 40 MG capsule Commonly known as: PRILOSEC Take 1 capsule (40 mg total) by mouth daily.   rosuvastatin 10 MG tablet Commonly known as: CRESTOR Take 1  tablet (10 mg total) by mouth daily.        Allergies:  Allergies  Allergen Reactions   Ambien [Zolpidem]     Sleep walking   Levofloxacin     Other reaction(s): Vomiting   Trazodone And Nefazodone     Remote reaction, patient unsure of reaction    Family History: Family History  Problem Relation Age of Onset   Cancer Mother    Heart failure Father    Heart failure Brother    Heart failure Brother    Heart failure Brother    Heart failure Brother    Cancer Maternal Grandmother    Cancer Maternal Grandfather     Social History:  reports that she has never smoked. She has never used smokeless tobacco. No history on file for alcohol use and drug use.   Physical Exam: BP (!) 143/77   Pulse 73   Ht '5\' 1"'$  (1.549 m)   Wt 149 lb (67.6 kg)   BMI 28.15 kg/m   Constitutional:  Alert and oriented, No acute distress. HEENT: [place] AT, moist mucus membranes.  Trachea midline, no masses. Cardiovascular: No clubbing, cyanosis, or edema. Respiratory: Normal respiratory effort, no increased work of breathing. Skin: No rashes, bruises or suspicious lesions. Neurologic: Grossly intact, no focal deficits, moving all 4 extremities. Psychiatric: Normal mood and affect.  Laboratory Data: Lab Results  Component Value Date   CREATININE 0.77 03/16/2022    Urinalysis Results for orders placed or performed in visit on 08/28/22  Microscopic Examination   Urine  Result Value Ref Range   WBC, UA 6-10 (A) 0 - 5 /hpf   RBC, Urine 3-10 (A) 0 - 2 /hpf   Epithelial Cells (non renal) 0-10 0 - 10 /hpf   Bacteria, UA Few None seen/Few  Urinalysis, Complete  Result Value Ref Range   Specific Gravity, UA 1.020 1.005 - 1.030   pH, UA 6.5 5.0 - 7.5   Color, UA Yellow Yellow   Appearance Ur Clear Clear   Leukocytes,UA 1+ (A) Negative   Protein,UA Negative Negative/Trace   Glucose, UA Negative Negative   Ketones, UA Negative Negative   RBC, UA Trace (A) Negative   Bilirubin, UA Negative  Negative   Urobilinogen, Ur 0.2 0.2 - 1.0 mg/dL   Nitrite, UA Negative Negative   Microscopic Examination See below:   Bladder Scan (Post Void Residual) in office  Result Value Ref Range   Scan Result 0ML     Assessment & Plan:    1. Recurrent UTI Urinalysis today consistent with resolving UTI, she is no longer symptomatic and emptying well  Suspect her first infections possibly related to diverticulitis which is now resolved status postsurgery  This is her first isolated infection outside of intra-abdominal process, we discussed holding off on further evaluation for the time being  but if she continues to have recurrent infections, would recommend cystoscopy, consideration of repeat imaging as well as topical estrogen cream and we discussed the evidence around this particular medication a postmenopausal woman  We discussed behavioral changes, initiation of cranberry tablets, d-mannose, and continuation of probiotics which she is already taking  Encourage fluids  We also discussed that if she has another urinary tract infection or UTI type symptoms, we would like her to be seen in our office for same or next a visit as possible so that we are able to treat and trend the frequency of infections.  She is agreeable this plan.   - Urinalysis, Complete - Bladder Scan (Post Void Residual) in office    Hollice Espy, MD  [facility] [address], [place], [place] [address] [phone]

## 2022-10-23 ENCOUNTER — Encounter: Payer: Self-pay | Admitting: Dermatology

## 2022-10-23 ENCOUNTER — Ambulatory Visit: Payer: Medicare Other | Admitting: Dermatology

## 2022-10-23 VITALS — BP 87/73 | HR 67

## 2022-10-23 DIAGNOSIS — D229 Melanocytic nevi, unspecified: Secondary | ICD-10-CM

## 2022-10-23 DIAGNOSIS — C4491 Basal cell carcinoma of skin, unspecified: Secondary | ICD-10-CM

## 2022-10-23 DIAGNOSIS — L821 Other seborrheic keratosis: Secondary | ICD-10-CM | POA: Diagnosis not present

## 2022-10-23 DIAGNOSIS — L570 Actinic keratosis: Secondary | ICD-10-CM

## 2022-10-23 DIAGNOSIS — L578 Other skin changes due to chronic exposure to nonionizing radiation: Secondary | ICD-10-CM

## 2022-10-23 DIAGNOSIS — Z1283 Encounter for screening for malignant neoplasm of skin: Secondary | ICD-10-CM

## 2022-10-23 DIAGNOSIS — Z808 Family history of malignant neoplasm of other organs or systems: Secondary | ICD-10-CM | POA: Diagnosis not present

## 2022-10-23 DIAGNOSIS — D492 Neoplasm of unspecified behavior of bone, soft tissue, and skin: Secondary | ICD-10-CM

## 2022-10-23 DIAGNOSIS — L814 Other melanin hyperpigmentation: Secondary | ICD-10-CM

## 2022-10-23 DIAGNOSIS — C44619 Basal cell carcinoma of skin of left upper limb, including shoulder: Secondary | ICD-10-CM | POA: Diagnosis not present

## 2022-10-23 HISTORY — DX: Basal cell carcinoma of skin, unspecified: C44.91

## 2022-10-23 NOTE — Patient Instructions (Addendum)
Cryotherapy Aftercare  Wash gently with soap and water everyday.   Apply Vaseline daily until healed.      Electrodesiccation and Curettage ("Scrape and Burn") Wound Care Instructions  Leave the original bandage on for 24 hours if possible.  If the bandage becomes soaked or soiled before that time, it is OK to remove it and examine the wound.  A small amount of post-operative bleeding is normal.  If excessive bleeding occurs, remove the bandage, place gauze over the site and apply continuous pressure (no peeking) over the area for 30 minutes. If this does not work, please call our clinic as soon as possible or page your doctor if it is after hours.   Once a day, cleanse the wound with soap and water. It is fine to shower. If a thick crust develops you may use a Q-tip dipped into dilute hydrogen peroxide (mix 1:1 with water) to dissolve it.  Hydrogen peroxide can slow the healing process, so use it only as needed.    After washing, apply petroleum jelly (Vaseline) or an antibiotic ointment if your doctor prescribed one for you, followed by a bandage.    For best healing, the wound should be covered with a layer of ointment at all times. If you are not able to keep the area covered with a bandage to hold the ointment in place, this may mean re-applying the ointment several times a day.  Continue this wound care until the wound has healed and is no longer open. It may take several weeks for the wound to heal and close.  Itching and mild discomfort is normal during the healing process.  If you have any discomfort, you can take Tylenol (acetaminophen) or ibuprofen as directed on the bottle. (Please do not take these if you have an allergy to them or cannot take them for another reason).  Some redness, tenderness and white or yellow material in the wound is normal healing.  If the area becomes very sore and red, or develops a thick yellow-green material (pus), it may be infected; please notify us.     Wound healing continues for up to one year following surgery. It is not unusual to experience pain in the scar from time to time during the interval.  If the pain becomes severe or the scar thickens, you should notify the office.    A slight amount of redness in a scar is expected for the first six months.  After six months, the redness will fade and the scar will soften and fade.  The color difference becomes less noticeable with time.  If there are any problems, return for a post-op surgery check at your earliest convenience.  To improve the appearance of the scar, you can use silicone scar gel, cream, or sheets (such as Mederma or Serica) every night for up to one year. These are available over the counter (without a prescription).  Please call our office at [phone] for any questions or concerns.     Melanoma ABCDEs  Melanoma is the most dangerous type of skin cancer, and is the leading cause of death from skin disease.  You are more likely to develop melanoma if you: Have light-colored skin, light-colored eyes, or red or blond hair Spend a lot of time in the sun Tan regularly, either outdoors or in a tanning bed Have had blistering sunburns, especially during childhood Have a close family member who has had a melanoma Have atypical moles or large birthmarks  Early detection  of melanoma is key since treatment is typically straightforward and cure rates are extremely high if we catch it early.   The first sign of melanoma is often a change in a mole or a new dark spot.  The ABCDE system is a way of remembering the signs of melanoma.  A for asymmetry:  The two halves do not match. B for border:  The edges of the growth are irregular. C for color:  A mixture of colors are present instead of an even brown color. D for diameter:  Melanomas are usually (but not always) greater than 67m - the size of a pencil eraser. E for evolution:  The spot keeps changing in size, shape, and  color.  Please check your skin once per month between visits. You can use a small mirror in front and a large mirror behind you to keep an eye on the back side or your body.   If you see any new or changing lesions before your next follow-up, please call to schedule a visit.  Please continue daily skin protection including broad spectrum sunscreen SPF 30+ to sun-exposed areas, reapplying every 2 hours as needed when you're outdoors.   Staying in the shade or wearing long sleeves, sun glasses (UVA+UVB protection) and wide brim hats (4-inch brim around the entire circumference of the hat) are also recommended for sun protection.      Due to recent changes in healthcare laws, you may see results of your pathology and/or laboratory studies on MyChart before the doctors have had a chance to review them. We understand that in some cases there may be results that are confusing or concerning to you. Please understand that not all results are received at the same time and often the doctors may need to interpret multiple results in order to provide you with the best plan of care or course of treatment. Therefore, we ask that you please give u[place]2 business days to thoroughly review all your results before contacting the office for clarification. Should we see a critical lab result, you will be contacted sooner.   If You Need Anything After Your Visit  If you have any questions or concerns for your doctor, please call our main line at [phone] press option 4 to reach your doctor's medical assistant. If no one answers, please leave a voicemail as directed and we will return your call as soon as possible. Messages left after 4 pm will be answered the following business day.   You may also send [place] message via [facility] We typically respond to MyChart messages within 1-2 business days.  For prescription refills, please ask your pharmacy to contact our office. Our fax number is [phone]  If you  have an urgent issue when the clinic is closed that cannot wait until the next business day, you can page your doctor at the number below.    Please note that while we do our best to be available for urgent issues outside of office hours, we are not available 24/7.   If you have an urgent issue and are unable to reach [place] you may choose to seek medical care at your doctor's office, retail clinic, urgent care center, or emergency room.  If you have a medical emergency, please immediately call 911 or go to the emergency department.  Pager Numbers  - Dr. KNehemiah Massed [phone] - Dr. MLaurence Ferrari [phone] - Dr. SNicole Kindred [phone] In the event of inclement weather, please call our  event of inclement weather, please call our main line at [phone] for an update on the status of any delays or closures.  Dermatology Medication Tips: Please keep the boxes that topical medications come in in order to help keep track of the instructions about where and how to use these. Pharmacies typically print the medication instructions only on the boxes and not directly on the medication tubes.   If your medication is too expensive, please contact our office at [phone] option 4 or send us a message through MyChart.   We are unable to tell what your co-pay for medications will be in advance as this is different depending on your insurance coverage. However, we may be able to find a substitute medication at lower cost or fill out paperwork to get insurance to cover a needed medication.   If a prior authorization is required to get your medication covered by your insurance company, please allow us 1-2 business days to complete this process.  Drug prices often vary depending on where the prescription is filled and some pharmacies may offer cheaper prices.  The website www.goodrx.com contains coupons for medications through different pharmacies. The prices here do not account for what the cost may be with help from  insurance (it may be cheaper with your insurance), but the website can give you the price if you did not use any insurance.  - You can print the associated coupon and take it with your prescription to the pharmacy.  - You may also stop by our office during regular business hours and pick up a GoodRx coupon card.  - If you need your prescription sent electronically to a different pharmacy, notify our office through [facility] MyChart or by phone at [phone] option 4.     Si Usted Necesita Algo Despus de Su Visita  Tambin puede enviarnos un mensaje a travs de MyChart. Por lo general respondemos a los mensajes de MyChart en el transcurso de 1 a 2 das hbiles.  Para renovar recetas, por favor pida a su farmacia que se ponga en contacto con nuestra oficina. Nuestro nmero de fax es el [phone].  Si tiene un asunto urgente cuando la clnica est cerrada y que no puede esperar hasta el siguiente da hbil, puede llamar/localizar a su doctor(a) al nmero que aparece a continuacin.   Por favor, tenga en cuenta que aunque hacemos todo lo posible para estar disponibles para asuntos urgentes fuera del horario de oficina, no estamos disponibles las 24 horas del da, los 7 das de la semana.   Si tiene un problema urgente y no puede comunicarse con nosotros, puede optar por buscar atencin mdica  en el consultorio de su doctor(a), en una clnica privada, en un centro de atencin urgente o en una sala de emergencias.  Si tiene una emergencia mdica, por favor llame inmediatamente al 911 o vaya a la sala de emergencias.  Nmeros de bper  - Dr. Kowalski: [phone]  - Dra. Moye: [phone]  - Dra. Stewart: [phone]  En caso de inclemencias del tiempo, por favor llame a nuestra lnea principal al [phone] para una actualizacin sobre el estado de cualquier retraso o cierre.  Consejos para la medicacin en dermatologa: Por favor, guarde las cajas en las que vienen los  medicamentos de uso tpico para ayudarle a seguir las instrucciones sobre dnde y cmo usarlos. Las farmacias generalmente imprimen las instrucciones del medicamento slo en las cajas y no directamente en los tubos del medicamento.   Si su   en contacto con nuestra oficina llamando al [phone] y presione la opcin 4 o envenos un mensaje a travs de Pharmacist, community.   No podemos decirle cul ser su copago por los medicamentos por adelantado ya que esto es diferente dependiendo de la cobertura de su seguro. Sin embargo, es posible que podamos encontrar un medicamento sustituto a Electrical engineer un formulario para que el seguro cubra el medicamento que se considera necesario.   Si se requiere una autorizacin previa para que su compaa de seguros [place] su medicamento, por favor permtanos de 1 a 2 das hbiles para completar este proceso.  Los precios de los medicamentos varan con frecuencia dependiendo del Environmental consultant de dnde se surte la receta y alguna farmacias pueden ofrecer precios ms baratos.  El sitio web www.goodrx.com tiene cupones para medicamentos de Airline pilot. Los precios aqu no tienen en cuenta lo que podra costar con la ayuda del seguro (puede ser ms barato con su seguro), pero el sitio web puede darle el precio si no utiliz Research scientist (physical sciences).  - Puede imprimir el cupn correspondiente y llevarlo con su receta a la farmacia.  - Tambin puede pasar por nuestra oficina durante el horario de atencin regular y Charity fundraiser una tarjeta de cupones de GoodRx.  - Si necesita que su receta se enve electrnicamente a una farmacia diferente, informe a nuestra oficina a travs de MyChart de [facility] o por telfono llamando al [phone] y presione la opcin 4.

## 2022-10-23 NOTE — Progress Notes (Signed)
New Patient Visit  Subjective  Grace Wheeler is a 76 y.o. female who presents for the following: lesion (Left upper arm. Growing. Dur: 8 months. Personal Hx of BCC and SCC. Mother had MM, COD) and Annual Exam (Waist up exam. Here to establish care). She has several other persistent scaly spots on her chest, hands and ears that are tender to touch.  The patient has spots, moles and lesions to be evaluated, some may be new or changing and the patient has concerns that these could be cancer.   Review of Systems: No other skin or systemic complaints except as noted in HPI or Assessment and Plan.    Objective  Well appearing patient in no apparent distress; mood and affect are within normal limits.  All skin waist up examined.  Left Upper Arm - Anterior 1.2 cm pink pearly nodule       right clavicle and mid sternum x2, R upper helix edge x1, L dorsum hand x2, R hand dorsum x1 (6) Erythematous thin papules/macules with gritty scale.   Back 82m brown waxy papule   Assessment & Plan   Family history of skin cancer - what type(s): Melanoma - who affected: Mother  Lentigines - Scattered tan macules - Due to sun exposure - Benign-appearing, observe - Recommend daily broad spectrum sunscreen SPF 30+ to sun-exposed areas, reapply every 2 hours as needed. - Call for any changes  Seborrheic Keratoses - Stuck-on, waxy, tan-brown papules and/or plaques  - Benign-appearing - Discussed benign etiology and prognosis. - Observe - Call for any changes  Melanocytic Nevi - Tan-brown and/or pink-flesh-colored symmetric macules and papules - Benign appearing on exam today - Observation - Call clinic for new or changing moles - Recommend daily use of broad spectrum spf 30+ sunscreen to sun-exposed areas.   Hemangiomas - Red papules - Discussed benign nature - Observe - Call for any changes  Actinic Damage - Chronic condition, secondary to cumulative UV/sun exposure - diffuse  scaly erythematous macules with underlying dyspigmentation - Recommend daily broad spectrum sunscreen SPF 30+ to sun-exposed areas, reapply every 2 hours as needed.  - Staying in the shade or wearing long sleeves, sun glasses (UVA+UVB protection) and wide brim hats (4-inch brim around the entire circumference of the hat) are also recommended for sun protection.  - Call for new or changing lesions.  Skin cancer screening performed today.   Neoplasm of skin Left Upper Arm - Anterior  Epidermal / dermal shaving  Lesion diameter (cm):  1.3 Informed consent: discussed and consent obtained   Patient was prepped and draped in usual sterile fashion: Area prepped with alcohol. Anesthesia: the lesion was anesthetized in a standard fashion   Anesthetic:  1% lidocaine w/ epinephrine 1-100,000 buffered w/ 8.4% NaHCO3 Instrument used: flexible razor blade   Hemostasis achieved with: pressure, aluminum chloride and electrodesiccation   Outcome: patient tolerated procedure well    Destruction of lesion  Destruction method: electrodesiccation and curettage   Timeout:  patient name, date of birth, surgical site, and procedure verified Anesthesia: the lesion was anesthetized in a standard fashion   Anesthetic:  1% lidocaine w/ epinephrine 1-100,000 buffered w/ 8.4% NaHCO3 Curettage performed in three different directions: Yes   Electrodesiccation performed over the curetted area: Yes   Final wound size (cm):  1.3 Hemostasis achieved with:  pressure, aluminum chloride and electrodesiccation Outcome: patient tolerated procedure well with no complications   Post-procedure details: wound care instructions given   Additional details:  Mupirocin ointment  and Bandaid applied   Specimen 1 - Surgical pathology Differential Diagnosis: R/O BCC  Check Margins: No  AK (actinic keratosis) (6) right clavicle and mid sternum x2, R upper helix edge x1, L dorsum hand x2, R hand dorsum x1  AK vs CDNH at right  helix  Actinic keratoses are precancerous spots that appear secondary to cumulative UV radiation exposure/sun exposure over time. They are chronic with expected duration over 1 year. A portion of actinic keratoses will progress to squamous cell carcinoma of the skin. It is not possible to reliably predict which spots will progress to skin cancer and so treatment is recommended to prevent development of skin cancer.  Recommend daily broad spectrum sunscreen SPF 30+ to sun-exposed areas, reapply every 2 hours as needed.  Recommend staying in the shade or wearing long sleeves, sun glasses (UVA+UVB protection) and wide brim hats (4-inch brim around the entire circumference of the hat). Call for new or changing lesions.  Destruction of lesion - right clavicle and mid sternum x2, R upper helix edge x1, L dorsum hand x2, R hand dorsum x1  Destruction method: cryotherapy   Informed consent: discussed and consent obtained   Lesion destroyed using liquid nitrogen: Yes   Region frozen until ice ball extended beyond lesion: Yes   Outcome: patient tolerated procedure well with no complications   Post-procedure details: wound care instructions given   Additional details:  Prior to procedure, discussed risks of blister formation, small wound, skin dyspigmentation, or rare scar following cryotherapy. Recommend Vaseline ointment to treated areas while healing.   Seborrheic keratosis Back  Reassured benign age-related growth.  Recommend observation.  Discussed cryotherapy if spot(s) become irritated or inflamed.   Return in about 6 months (around 04/24/2023) for TBSE, HxSCC/BCC.  I, Emelia Salisbury, CMA, am acting as scribe for Brendolyn Patty, MD.  Documentation: I have reviewed the above documentation for accuracy and completeness, and I agree with the above.  Brendolyn Patty MD

## 2022-11-05 ENCOUNTER — Telehealth: Payer: Self-pay

## 2022-11-05 NOTE — Telephone Encounter (Signed)
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

## 2022-11-05 NOTE — Telephone Encounter (Signed)
Discussed pathology results. Patient voiced understanding.  

## 2022-11-07 ENCOUNTER — Ambulatory Visit: Payer: Medicare Other | Admitting: Family Medicine

## 2022-12-02 NOTE — Progress Notes (Unsigned)
I,[name],acting as a scribe for [employer], MD.,have documented all relevant documentation on the behalf of Eulis Foster, MD,as directed by  Eulis Foster, MD while in the presence of Eulis Foster, MD.   Established patient visit   Patient: Grace Wheeler   DOB: August 24, 1946   76 y.o. Female  MRN: [id number] Visit Date: 12/04/2022  Today's healthcare provider: Eulis Foster, MD   Chief Complaint  Patient presents with   Urinary Incontinence   Subjective    HPI   Patient need medication refill on Clonazepam  Urinary Incontinence: Patient complains of urinary incontinence. This has been present for several years. She leaks urine with bending, coughing, lifting, laughing, sneezing. Patient describes the symptoms as  a blunted sense of urge to urinate, bladder cramping, decreased force of stream, and urine leakage with coughing/heavy physical activity.  Factors associated with symptoms include reports that she has seen Dr. Erlene Quan for this before. Evaluation to date includes [place]. Reports she has had a procedure for this back in the 1980s.   Neuropathy: Patient also concerns about tingling, burning, numbness in feet & legs. This has been going on for 5 months. This is mostly bothersome at night time.   GERD: Reports symptoms stable. Will call when she need a refill.  Insomnia: reports that she is doing a little better. Reports that with this medication feels symptoms are better.  Medications: Outpatient Medications Prior to Visit  Medication Sig   acetaminophen (TYLENOL) 650 MG CR tablet Tylenol Arthritis Pain 650 mg tablet,extended release  Take 1 tablet twice a day by oral route.   diclofenac Sodium (VOLTAREN) 1 % GEL diclofenac 1 % topical gel  APPLY 2 GRAM TO THE AFFECTED AREA(S) BY TOPICAL ROUTE 4 TIMES PER DAY   hydrochlorothiazide (HYDRODIURIL) 25 MG tablet hydrochlorothiazide 25 mg tablet  TAKE 1 TABLET BY  MOUTH  DAILY   hydroxychloroquine (PLAQUENIL) 200 MG tablet hydroxychloroquine 200 mg tablet  TAKE 1 TABLET BY MOUTH  DAILY   Light Mineral Oil-Mineral Oil 0.5-0.5 % EMUL Retaine MGD (PF) 0.5 %-0.5 % eye drops in a dropperette  As Needed   metoprolol succinate (TOPROL-XL) 50 MG 24 hr tablet metoprolol succinate ER 50 mg tablet,extended release 24 hr  TAKE 1 TABLET BY MOUTH AT  BEDTIME   omeprazole (PRILOSEC) 40 MG capsule Take 1 capsule (40 mg total) by mouth daily.   rosuvastatin (CRESTOR) 10 MG tablet Take 1 tablet (10 mg total) by mouth daily.   [DISCONTINUED] clonazePAM (KLONOPIN) 1 MG tablet Take 1 tablet (1 mg total) by mouth at bedtime.   No facility-administered medications prior to visit.    Review of Systems     Objective    BP 125/74 (BP Location: Left Arm, Patient Position: Sitting, Cuff Size: Normal)   Pulse 70   Temp 98.6 F (37 C) (Oral)   Resp 16   Wt 149 lb (67.6 kg)   BMI 28.15 kg/m    Physical Exam Vitals reviewed.  Constitutional:      General: She is not in acute distress.    Appearance: Normal appearance. She is not ill-appearing, toxic-appearing or diaphoretic.  Eyes:     Conjunctiva/sclera: Conjunctivae normal.  Cardiovascular:     Rate and Rhythm: Normal rate and regular rhythm.     Pulses: Normal pulses.     Heart sounds: Normal heart sounds. No murmur heard.    No friction rub. No gallop.  Pulmonary:     Effort: Pulmonary effort is  normal. No respiratory distress.     Breath sounds: Normal breath sounds. No stridor. No wheezing, rhonchi or rales.  Abdominal:     General: Bowel sounds are normal. There is no distension.     Palpations: Abdomen is soft.     Tenderness: There is no abdominal tenderness.  Musculoskeletal:        General: No swelling, tenderness, deformity or signs of injury.     Right lower leg: Edema present.     Left lower leg: Edema present.     Comments: Trace bilateral edema  Normal ROM  5/5 strength bilaterally   Skin:    Findings: No erythema or rash.  Neurological:     Mental Status: She is alert and oriented to person, place, and time.     No results found for any visits on 12/04/22.  Assessment & Plan     Problem List Items Addressed This Visit       Nervous and Auditory   Neuropathy    Neuopathy symptoms  Will check CBC, B12, Mag and BMP  Will have patient start requip 0.'25mg'$  at bedtime for restless legs  Recommended follow up during AWV       Relevant Orders   Vitamin B12   CBC   Basic Metabolic Panel (BMET)   Magnesium     Other   Insomnia    Refills provided for klonopin '1mg'$  qHS  PDMP reviewed and appropriate       Relevant Medications   clonazePAM (KLONOPIN) 1 MG tablet   Stress incontinence - Primary    Referred to pelvic floor PT  Recommended kegel exercises, handout given, see AVS        Relevant Orders   Ambulatory referral to Physical Therapy   Encounter for screening mammogram for malignant neoplasm of breast    Mammogram ordered  Patient given [facility] contact information for scheduling       Relevant Orders   MM 3D SCREEN BREAST BILATERAL     Return in about 1 month (around 01/02/2023).      The entirety of the information documented in the History of Present Illness, Review of Systems and Physical Exam were personally obtained by me. Portions of this information were initially documented by Lyndel Pleasure, CMA and reviewed by me for thoroughness and accuracy.Eulis Foster, MD   Eulis Foster, MD  [facility] [phone] (phone) [phone] (fax)  [place]

## 2022-12-04 ENCOUNTER — Ambulatory Visit: Payer: Medicare Other | Admitting: Family Medicine

## 2022-12-04 ENCOUNTER — Encounter: Payer: Self-pay | Admitting: Family Medicine

## 2022-12-04 VITALS — BP 125/74 | HR 70 | Temp 98.6°F | Resp 16 | Wt 149.0 lb

## 2022-12-04 DIAGNOSIS — G4700 Insomnia, unspecified: Secondary | ICD-10-CM | POA: Diagnosis not present

## 2022-12-04 DIAGNOSIS — Z1231 Encounter for screening mammogram for malignant neoplasm of breast: Secondary | ICD-10-CM | POA: Insufficient documentation

## 2022-12-04 DIAGNOSIS — N393 Stress incontinence (female) (male): Secondary | ICD-10-CM | POA: Insufficient documentation

## 2022-12-04 DIAGNOSIS — G629 Polyneuropathy, unspecified: Secondary | ICD-10-CM | POA: Insufficient documentation

## 2022-12-04 MED ORDER — CLONAZEPAM 1 MG PO TABS: 1.0000 mg | ORAL_TABLET | Freq: Every day | ORAL | 1 refills | Status: DC

## 2022-12-04 MED ORDER — ROPINIROLE HCL 0.25 MG PO TABS: 0.2500 mg | ORAL_TABLET | Freq: Every day | ORAL | 0 refills | Status: DC

## 2022-12-04 NOTE — Assessment & Plan Note (Signed)
Referred to pelvic floor PT  Recommended kegel exercises, handout given, see AVS

## 2022-12-04 NOTE — Assessment & Plan Note (Signed)
Mammogram ordered  Patient given [facility] contact information for scheduling

## 2022-12-04 NOTE — Assessment & Plan Note (Signed)
Refills provided for klonopin '1mg'$  qHS  PDMP reviewed and appropriate

## 2022-12-04 NOTE — Assessment & Plan Note (Signed)
Neuopathy symptoms  Will check CBC, B12, Mag and BMP  Will have patient start requip 0.'25mg'$  at bedtime for restless legs  Recommended follow up during AWV

## 2022-12-04 NOTE — Patient Instructions (Addendum)
[facility] [place],    [id number] [address] Directions Main: [phone]  We will test your vitamin levels and for anemia that could be contributing to the symptoms in your legs.  We will follow up with results of labs once they are available.   Please schedule your annual wellness visit with our front desk staff.    Kegel Exercises  Kegel exercises can help strengthen your pelvic floor muscles. The pelvic floor is a group of muscles that support your rectum, small intestine, and bladder. In females, pelvic floor muscles also help support the uterus. These muscles help you control the flow of urine and stool (feces). Kegel exercises are painless and simple. They do not require any equipment. Your provider may suggest Kegel exercises to: Improve bladder and bowel control. Improve sexual response. Improve weak pelvic floor muscles after surgery to remove the uterus (hysterectomy) or after pregnancy, in females. Improve weak pelvic floor muscles after prostate gland removal or surgery, in males. Kegel exercises involve squeezing your pelvic floor muscles. These are the same muscles you squeeze when you try to stop the flow of urine or keep from passing gas. The exercises can be done while sitting, standing, or lying down, but it is best to vary your position. Ask your health care provider which exercises are safe for you. Do exercises exactly as told by your health care provider and adjust them as directed. Do not begin these exercises until told by your health care provider. Exercises How to do Kegel exercises: Squeeze your pelvic floor muscles tight. You should feel a tight lift in your rectal area. If you are a female, you should also feel a tightness in your vaginal area. Keep your stomach, buttocks, and legs relaxed. Hold the muscles tight for up to 10 seconds. Breathe normally. Relax your muscles for up to 10  seconds. Repeat as told by your health care provider. Repeat this exercise daily as told by your health care provider. Continue to do this exercise for at least 4-6 weeks, or for as long as told by your health care provider. You may be referred to a physical therapist who can help you learn more about how to do Kegel exercises. Depending on your condition, your health care provider may recommend: Varying how long you squeeze your muscles. Doing several sets of exercises every day. Doing exercises for several weeks. Making Kegel exercises a part of your regular exercise routine. This information is not intended to replace advice given to you by your health care provider. Make sure you discuss any questions you have with your health care provider. Document Revised: 03/01/2021 Document Reviewed: 03/01/2021 Elsevier Patient Education  [place].

## 2022-12-05 LAB — CBC
Hematocrit: 38.5 % (ref 34.0–46.6)
Hemoglobin: 12.9 g/dL (ref 11.1–15.9)
MCH: 29.1 pg (ref 26.6–33.0)
MCHC: 33.5 g/dL (ref 31.5–35.7)
MCV: 87 fL (ref 79–97)
Platelets: 297 10*3/uL (ref 150–450)
RBC: 4.44 x10E6/uL (ref 3.77–5.28)
RDW: 12.8 % (ref 11.7–15.4)
WBC: 6.5 10*3/uL (ref 3.4–10.8)

## 2022-12-05 LAB — BASIC METABOLIC PANEL
BUN/Creatinine Ratio: 15 (ref 12–28)
BUN: 13 mg/dL (ref 8–27)
CO2: 27 mmol/L (ref 20–29)
Calcium: 9.8 mg/dL (ref 8.7–10.3)
Chloride: 101 mmol/L (ref 96–106)
Creatinine, Ser: 0.89 mg/dL (ref 0.57–1.00)
Glucose: 92 mg/dL (ref 70–99)
Potassium: 3.8 mmol/L (ref 3.5–5.2)
Sodium: 143 mmol/L (ref 134–144)
eGFR: 68 mL/min/{1.73_m2} (ref >59)

## 2022-12-05 LAB — MAGNESIUM: Magnesium: 2 mg/dL (ref 1.6–2.3)

## 2022-12-05 LAB — VITAMIN B12: Vitamin B-12: 261 pg/mL (ref 232–1245)

## 2022-12-06 ENCOUNTER — Other Ambulatory Visit: Payer: Self-pay | Admitting: Family Medicine

## 2022-12-06 DIAGNOSIS — K219 Gastro-esophageal reflux disease without esophagitis: Secondary | ICD-10-CM

## 2022-12-06 NOTE — Telephone Encounter (Signed)
Requested Prescriptions  Pending Prescriptions Disp Refills   omeprazole (PRILOSEC) 40 MG capsule [Pharmacy Med Name: Omeprazole 40 MG Oral Capsule Delayed Release] 90 capsule 3    Sig: TAKE 1 CAPSULE BY MOUTH DAILY     Gastroenterology: Proton Pump Inhibitors Passed - 12/06/2022  6:25 AM      Passed - Valid encounter within last 12 months    Recent Outpatient Visits           2 days ago Stress incontinence   [place] Simmons-Robinson, [place], MD   4 months ago Insomnia, unspecified type   [place] Simmons-Robinson, [place], MD   5 months ago Insomnia, unspecified type   [place], MD   8 months ago DOE (dyspnea on exertion)   [place] T[place] Joe T, FNP       Future Appointments             In 2 weeks Simmons-Robinson, Riki Sheer, MD [facility]

## 2022-12-23 ENCOUNTER — Other Ambulatory Visit: Payer: Self-pay

## 2022-12-23 ENCOUNTER — Telehealth: Payer: Self-pay | Admitting: Family Medicine

## 2022-12-23 MED ORDER — HYDROCHLOROTHIAZIDE 25 MG PO TABS: ORAL_TABLET | ORAL | 1 refills | Status: DC

## 2022-12-23 MED ORDER — HYDROCHLOROTHIAZIDE 25 MG PO TABS: 25.0000 mg | ORAL_TABLET | Freq: Every day | ORAL | 1 refills | Status: DC

## 2022-12-23 NOTE — Telephone Encounter (Signed)
[place] faxed refill request for the following medications:  hydrochlorothiazide (HYDRODIURIL) 25 MG tablet    Please advise.

## 2022-12-24 NOTE — Progress Notes (Signed)
I,Grace Wheeler,acting as a scribe for [name], MD.,have documented all relevant documentation on the behalf of Grace Ramp, MD,as directed by  Grace Ramp, MD while in the presence of Grace Ramp, MD.   Annual Wellness Visit     Patient: Grace Wheeler, Female    DOB: 07-01-1946, 77 y.o.   MRN: [id number] Visit Date: 12/25/2022  Today's Provider: [name], MD   Chief Complaint  Patient presents with   Annual Exam   Subjective    Grace Wheeler is a 77 y.o. female who presents today for her Annual Wellness Visit.  She reports consuming a general diet. She would like to get more information on a diet that is healthy in general. Home exercise routine includes walks 3-4 times a week for 30 minutes. She generally feels well. She reports sleeping fairly well. She does not have additional problems to discuss today.   HPI RLL: patient was started on requip for restless legs at last visit. Reports that she didn't take it.   Medications: Outpatient Medications Prior to Visit  Medication Sig Note   acetaminophen (TYLENOL) 650 MG CR tablet Tylenol Arthritis Pain 650 mg tablet,extended release  Take 1 tablet twice a day by oral route.    clonazePAM (KLONOPIN) 1 MG tablet Take 1 tablet (1 mg total) by mouth at bedtime.    diclofenac Sodium (VOLTAREN) 1 % GEL diclofenac 1 % topical gel  APPLY 2 GRAM TO THE AFFECTED AREA(S) BY TOPICAL ROUTE 4 TIMES PER DAY    gabapentin (NEURONTIN) 100 MG capsule Take 1 capsule by mouth at bedtime.    hydrochlorothiazide (HYDRODIURIL) 25 MG tablet Take 1 tablet (25 mg total) by mouth daily.    hydroxychloroquine (PLAQUENIL) 200 MG tablet hydroxychloroquine 200 mg tablet  TAKE 1 TABLET BY MOUTH  DAILY    Light Mineral Oil-Mineral Oil 0.5-0.5 % EMUL Retaine MGD (PF) 0.5 %-0.5 % eye drops in a dropperette  As Needed    metoprolol succinate (TOPROL-XL) 50 MG 24 hr tablet metoprolol succinate  ER 50 mg tablet,extended release 24 hr  TAKE 1 TABLET BY MOUTH AT  BEDTIME    omeprazole (PRILOSEC) 40 MG capsule TAKE 1 CAPSULE BY MOUTH DAILY    rosuvastatin (CRESTOR) 10 MG tablet Take 1 tablet (10 mg total) by mouth daily.    [DISCONTINUED] rOPINIRole (REQUIP) 0.25 MG tablet Take 1 tablet (0.25 mg total) by mouth at bedtime. 12/25/2022: patient never started this med   No facility-administered medications prior to visit.    Allergies  Allergen Reactions   Ambien [Zolpidem]     Sleep walking   Levofloxacin     Other reaction(s): Vomiting   Trazodone And Nefazodone     Remote reaction, patient unsure of reaction    Patient Care Team: Grace Ramp, MD as PCP - General (Family Medicine) Grace Niece, MD (Dermatology) Grace Hammersmith, MD as Consulting Physician (Rheumatology)  Review of Systems  All other systems reviewed and are negative.       Objective    Vitals: BP 138/79 (BP Location: Right Arm, Patient Position: Sitting, Cuff Size: Large)   Pulse 69   Temp 98.2 F (36.8 C) (Oral)   Resp 16   Ht 5\' 1"  (1.549 m)   Wt 149 lb (67.6 kg)   BMI 28.15 kg/m     Physical Exam Vitals reviewed.  Constitutional:      General: She is not in acute distress.    Appearance: Normal appearance. She is not  ill-appearing, toxic-appearing or diaphoretic.  Eyes:     Conjunctiva/sclera: Conjunctivae normal.  Cardiovascular:     Rate and Rhythm: Normal rate and regular rhythm.     Pulses: Normal pulses.     Heart sounds: Normal heart sounds. No murmur heard.    No friction rub. No gallop.  Pulmonary:     Effort: Pulmonary effort is normal. No respiratory distress.     Breath sounds: Normal breath sounds. No stridor. No wheezing, rhonchi or rales.  Abdominal:     General: Bowel sounds are normal. There is no distension.     Palpations: Abdomen is soft.     Tenderness: There is no abdominal tenderness.  Musculoskeletal:     Right lower leg: No edema.     Left  lower leg: No edema.  Skin:    Findings: No erythema or rash.  Neurological:     Mental Status: She is alert and oriented to person, place, and time.     Most recent functional status assessment:    12/04/2022    1:15 PM  In your present state of health, do you have any difficulty performing the following activities:  Hearing? 1  Vision? 0  Difficulty concentrating or making decisions? 0  Walking or climbing stairs? 1  Dressing or bathing? 0  Doing errands, shopping? 0   Most recent fall risk assessment:    12/04/2022    1:14 PM  Fall Risk   Falls in the past year? 0  Number falls in past yr: 0  Injury with Fall? 0  Risk for fall due to : No Fall Risks    Most recent depression screenings:    12/25/2022    1:13 PM 12/04/2022    1:14 PM  PHQ 2/9 Scores  PHQ - 2 Score 0 0  PHQ- 9 Score 0 2   Most recent cognitive screening:     No data to display         Most recent Audit-C alcohol use screening    08/02/2022    1:29 PM  Alcohol Use Disorder Test (AUDIT)  1. How often do you have a drink containing alcohol? 0  2. How many drinks containing alcohol do you have on a typical day when you are drinking? 0  3. How often do you have six or more drinks on one occasion? 0  AUDIT-C Score 0   A score of 3 or more in women, and 4 or more in men indicates increased risk for alcohol abuse, EXCEPT if all of the points are from question 1   No results found for any visits on 12/25/22.  Assessment & Plan     Annual wellness visit done today including the all of the following: Reviewed patient's Family Medical History Reviewed and updated list of patient's medical providers Assessment of cognitive impairment was done Assessed patient's functional ability Established a written schedule for health screening services Health Risk Assessent Completed and Reviewed  Exercise Activities and Dietary recommendations  Goals   None     Immunization History  Administered  Date(s) Administered   Influenza-Unspecified 07/18/2022   Moderna Sars-Covid-2 Vaccination 12/14/2019, 01/11/2020   Pneumococcal Polysaccharide-23 04/07/2019   Zoster Recombinat (Shingrix) 07/18/2022, 09/18/2022    Health Maintenance  Topic Date Due   DTaP/Tdap/Td (1 - Tdap) Never done   MAMMOGRAM  Never done   COVID-19 Vaccine (3 - Moderna risk series) 02/08/2020   Pneumonia Vaccine 3+ Years old (2 of 2 - PCV) 04/06/2020  Hepatitis C Screening  12/27/2023 (Originally 03/18/1964)   Medicare Annual Wellness (AWV)  12/26/2023   COLONOSCOPY (Pts 45-68yrs Insurance coverage will need to be confirmed)  10/16/2028   INFLUENZA VACCINE  Completed   DEXA SCAN  Completed   Zoster Vaccines- Shingrix  Completed   HPV VACCINES  Aged Out     Discussed health benefits of physical activity, and encouraged her to engage in regular exercise appropriate for her age and condition.    Problem List Items Addressed This Visit       Other   Encounter for annual wellness exam in Medicare patient - Primary    Patient presents for annual wellness exam Patient's history was reviewed Discussed Mediterranean diet is heart healthy diet to help with blood pressure control as well as cholesterol management Patient was given AVS with more detail for dietary recommendations Encourage patient to continue her regular physical activity Updated patient's providers including her rheumatologist and dermatologist Updated vaccine records for pneumococcal vaccination  Patient declines hepatitis C screening  Mammogram is scheduled for Jan 03, 2023  Last colonoscopy was in 2019  Patient will follow-up for annual wellness visit in 1 year         Return in about 6 months (around 06/25/2023) for chronic conditions .       The entirety of the information documented in the History of Present Illness, Review of Systems and Physical Exam were personally obtained by me. Portions of this information were initially  documented by Hetty Ely, CMA and reviewed by me for thoroughness and accuracy.Grace Ramp, MD     Grace Ramp, MD  [facility] [phone] (phone) [phone] (fax)  [facility]

## 2022-12-25 ENCOUNTER — Encounter: Payer: Self-pay | Admitting: Family Medicine

## 2022-12-25 ENCOUNTER — Ambulatory Visit (INDEPENDENT_AMBULATORY_CARE_PROVIDER_SITE_OTHER): Payer: Medicare Other | Admitting: Family Medicine

## 2022-12-25 VITALS — BP 138/79 | HR 69 | Temp 98.2°F | Resp 16 | Ht 61.0 in | Wt 149.0 lb

## 2022-12-25 DIAGNOSIS — Z0000 Encounter for general adult medical examination without abnormal findings: Secondary | ICD-10-CM | POA: Insufficient documentation

## 2022-12-25 NOTE — Patient Instructions (Signed)
Mediterranean Diet ?A Mediterranean diet refers to food and lifestyle choices that are based on the traditions of countries located on the [place] Sea. It focuses on eating more fruits, vegetables, whole grains, beans, nuts, seeds, and heart-healthy fats, and eating less dairy, meat, eggs, and processed foods with added sugar, salt, and fat. This way of eating has been shown to help prevent certain conditions and improve outcomes for people who have chronic diseases, like kidney disease and heart disease. ?What are tips for following this plan? ?Reading food labels ?Check the serving size of packaged foods. For foods such as rice and pasta, the serving size refers to the amount of cooked product, not dry. ?Check the total fat in packaged foods. Avoid foods that have saturated fat or trans fats. ?Check the ingredient list for added sugars, such as corn syrup. ?Shopping ? ?Buy a variety of foods that offer a balanced diet, including: ?Fresh fruits and vegetables (produce). ?Grains, beans, nuts, and seeds. Some of these may be available in unpackaged forms or large amounts (in bulk). ?Fresh seafood. ?Poultry and eggs. ?Low-fat dairy products. ?Buy whole ingredients instead of prepackaged foods. ?Buy fresh fruits and vegetables in-season from local farmers markets. ?Buy plain frozen fruits and vegetables. ?If you do not have access to quality fresh seafood, buy precooked frozen shrimp or canned fish, such as tuna, salmon, or sardines. ?Stock your pantry so you always have certain foods on hand, such as olive oil, canned tuna, canned tomatoes, rice, pasta, and beans. ?Cooking ?Cook foods with extra-virgin olive oil instead of using butter or other vegetable oils. ?Have meat as a side dish, and have vegetables or grains as your main dish. This means having meat in small portions or adding small amounts of meat to foods like pasta or stew. ?Use beans or vegetables instead of meat in common dishes like chili or  lasagna. ?Experiment with different cooking methods. Try roasting, broiling, steaming, and saut?ing vegetables. ?Add frozen vegetables to soups, stews, pasta, or rice. ?Add nuts or seeds for added healthy fats and plant protein at each meal. You can add these to yogurt, salads, or vegetable dishes. ?Marinate fish or vegetables using olive oil, lemon juice, garlic, and fresh herbs. ?Meal planning ?Plan to eat one vegetarian meal one day each week. Try to work up to two vegetarian meals, if possible. ?Eat seafood two or more times a week. ?Have healthy snacks readily available, such as: ?Vegetable sticks with hummus. ?Greek yogurt. ?Fruit and nut trail mix. ?Eat balanced meals throughout the week. This includes: ?Fruit: 2-3 servings a day. ?Vegetables: 4-5 servings a day. ?Low-fat dairy: 2 servings a day. ?Fish, poultry, or lean meat: 1 serving a day. ?Beans and legumes: 2 or more servings a week. ?Nuts and seeds: 1-2 servings a day. ?Whole grains: 6-8 servings a day. ?Extra-virgin olive oil: 3-4 servings a day. ?Limit red meat and sweets to only a few servings a month. ?Lifestyle ? ?Cook and eat meals together with your family, when possible. ?Drink enough fluid to keep your urine pale yellow. ?Be physically active every day. This includes: ?Aerobic exercise like running or swimming. ?Leisure activities like gardening, walking, or housework. ?Get 7-8 hours of sleep each night. ?If recommended by your health care provider, drink red wine in moderation. This means 1 glass a day for nonpregnant women and 2 glasses a day for men. A glass of wine equals 5 oz (150 mL). ?What foods should I eat? ?Fruits ?Apples. Apricots. Avocado. Berries. Bananas. Cherries. Dates.   Figs. Grapes. Lemons. Melon. Oranges. Peaches. Plums. Pomegranate. ?Vegetables ?Artichokes. Beets. Broccoli. Cabbage. Carrots. Eggplant. Green beans. Chard. Kale. Spinach. Onions. Leeks. Peas. Squash. Tomatoes. Peppers. Radishes. ?Grains ?Whole-grain pasta. Brown  rice. Bulgur wheat. Polenta. Couscous. Whole-wheat bread. Oatmeal. Quinoa. ?Meats and other proteins ?Beans. Almonds. Sunflower seeds. Pine nuts. Peanuts. Cod. Salmon. Scallops. Shrimp. Tuna. Tilapia. Clams. Oysters. Eggs. Poultry without skin. ?Dairy ?Low-fat milk. Cheese. Greek yogurt. ?Fats and oils ?Extra-virgin olive oil. Avocado oil. Grapeseed oil. ?Beverages ?Water. Red wine. Herbal tea. ?Sweets and desserts ?Greek yogurt with honey. Baked apples. Poached pears. Trail mix. ?Seasonings and condiments ?Basil. Cilantro. Coriander. Cumin. Mint. Parsley. Sage. Rosemary. Tarragon. Garlic. Oregano. Thyme. Pepper. Balsamic vinegar. Tahini. Hummus. Tomato sauce. Olives. Mushrooms. ?The items listed above may not be a complete list of foods and beverages you can eat. Contact a dietitian for more information. ?What foods should I limit? ?This is a list of foods that should be eaten rarely or only on special occasions. ?Fruits ?Fruit canned in syrup. ?Vegetables ?Deep-fried potatoes (french fries). ?Grains ?Prepackaged pasta or rice dishes. Prepackaged cereal with added sugar. Prepackaged snacks with added sugar. ?Meats and other proteins ?Beef. Pork. Lamb. Poultry with skin. Hot dogs. Bacon. ?Dairy ?Ice cream. Sour cream. Whole milk. ?Fats and oils ?Butter. Canola oil. Vegetable oil. Beef fat (tallow). Lard. ?Beverages ?Juice. Sugar-sweetened soft drinks. Beer. Liquor and spirits. ?Sweets and desserts ?Cookies. Cakes. Pies. Candy. ?Seasonings and condiments ?Mayonnaise. Pre-made sauces and marinades. ?The items listed above may not be a complete list of foods and beverages you should limit. Contact a dietitian for more information. ?Summary ?The Mediterranean diet includes both food and lifestyle choices. ?Eat a variety of fresh fruits and vegetables, beans, nuts, seeds, and whole grains. ?Limit the amount of red meat and sweets that you eat. ?If recommended by your health care provider, drink red wine in moderation.  This means 1 glass a day for nonpregnant women and 2 glasses a day for men. A glass of wine equals 5 oz (150 mL). ?This information is not intended to replace advice given to you by your health care provider. Make sure you discuss any questions you have with your health care provider. ?Document Revised: 11/26/2019 Document Reviewed: 09/23/2019 ?Elsevier Patient Education ? 2023 Elsevier Inc. ? ?

## 2022-12-26 NOTE — Assessment & Plan Note (Signed)
Patient presents for annual wellness exam Patient's history was reviewed Discussed Mediterranean diet is heart healthy diet to help with blood pressure control as well as cholesterol management Patient was given AVS with more detail for dietary recommendations Encourage patient to continue her regular physical activity Updated patient's providers including her rheumatologist and dermatologist Updated vaccine records for pneumococcal vaccination  Patient declines hepatitis C screening  Mammogram is scheduled for Jan 03, 2023  Last colonoscopy was in 2019  Patient will follow-up for annual wellness visit in 1 year

## 2023-01-03 ENCOUNTER — Ambulatory Visit
Admission: RE | Admit: 2023-01-03 | Discharge: 2023-01-03 | Disposition: A | Discharge status: Home / Self Care | Payer: Medicare Other | Source: Ambulatory Visit | Attending: Family Medicine | Admitting: Family Medicine

## 2023-01-03 DIAGNOSIS — Z1231 Encounter for screening mammogram for malignant neoplasm of breast: Secondary | ICD-10-CM

## 2023-01-07 ENCOUNTER — Inpatient Hospital Stay
Admission: RE | Admit: 2023-01-07 | Discharge: 2023-01-07 | Disposition: A | Discharge status: Home / Self Care | Payer: Self-pay | Source: Ambulatory Visit | Attending: *Deleted | Admitting: *Deleted

## 2023-01-07 ENCOUNTER — Other Ambulatory Visit: Payer: Self-pay | Admitting: *Deleted

## 2023-01-07 DIAGNOSIS — Z1231 Encounter for screening mammogram for malignant neoplasm of breast: Secondary | ICD-10-CM

## 2023-01-17 ENCOUNTER — Other Ambulatory Visit: Payer: Self-pay | Admitting: Family Medicine

## 2023-01-17 NOTE — Telephone Encounter (Signed)
Received refill request    The original prescription was discontinued on 12/25/2022 by Eulis Foster, MD for the following reason: Prescription never filled. Renewing this prescription may not be appropriate.    Please advise, thank you!

## 2023-01-23 ENCOUNTER — Other Ambulatory Visit: Payer: Self-pay | Admitting: Family Medicine

## 2023-01-27 ENCOUNTER — Ambulatory Visit: Payer: Medicare Other | Admitting: Dermatology

## 2023-02-17 ENCOUNTER — Ambulatory Visit: Payer: Medicare Other | Admitting: Dermatology

## 2023-04-17 NOTE — Progress Notes (Signed)
Established patient visit  Patient: Grace Wheeler   DOB: Mar 17, 1946   77 y.o. Female  MRN: [id number] Visit Date: 04/18/2023  Today's healthcare provider: Debera Lat, PA-C   Chief Complaint  Patient presents with   Bronchitis   Subjective    HPI  Patient is a 77 year old female who presents for what she says she is certain is bronchitis.  She said she gets this each year.  She began on 04/12/23 having symptoms of a very watery eye and later that night she began having a non productive cough that progressed into fatigue, aches, raw throat, wheezing and shortness of breath.   She said her temperature has been 99.9 at home.   She has been using Thera Flu cough syrup with no relief and OTC cold and flu tablets at night.  She reports she is supposed to go on vacation on Monday and would like to start getting better before then.,     12/25/2022    1:13 PM 12/04/2022    1:14 PM 08/02/2022    1:28 PM  Depression screen PHQ 2/9  Decreased Interest 0 0 0  Down, Depressed, Hopeless 0 0 0  PHQ - 2 Score 0 0 0  Altered sleeping 0 1 2  Tired, decreased energy 0 1 1  Change in appetite 0 0 1  Feeling bad or failure about yourself  0 0 0  Trouble concentrating 0 0 0  Moving slowly or fidgety/restless 0 0 1  Suicidal thoughts 0 0 0  PHQ-9 Score 0 2 5  Difficult doing work/chores Not difficult at all Not difficult at all Not difficult at all       No data to display          Medications: Outpatient Medications Prior to Visit  Medication Sig   acetaminophen (TYLENOL) 650 MG CR tablet Tylenol Arthritis Pain 650 mg tablet,extended release  Take 1 tablet twice a day by oral route.   clonazePAM (KLONOPIN) 1 MG tablet Take 1 tablet (1 mg total) by mouth at bedtime.   diclofenac Sodium (VOLTAREN) 1 % GEL diclofenac 1 % topical gel  APPLY 2 GRAM TO THE AFFECTED AREA(S) BY TOPICAL ROUTE 4 TIMES PER DAY   gabapentin (NEURONTIN) 100 MG capsule Take 1 capsule by mouth at bedtime.    hydrochlorothiazide (HYDRODIURIL) 25 MG tablet Take 1 tablet (25 mg total) by mouth daily.   hydroxychloroquine (PLAQUENIL) 200 MG tablet hydroxychloroquine 200 mg tablet  TAKE 1 TABLET BY MOUTH  DAILY   Light Mineral Oil-Mineral Oil 0.5-0.5 % EMUL Retaine MGD (PF) 0.5 %-0.5 % eye drops in a dropperette  As Needed   metoprolol succinate (TOPROL-XL) 50 MG 24 hr tablet metoprolol succinate ER 50 mg tablet,extended release 24 hr  TAKE 1 TABLET BY MOUTH AT  BEDTIME   omeprazole (PRILOSEC) 40 MG capsule TAKE 1 CAPSULE BY MOUTH DAILY   rosuvastatin (CRESTOR) 10 MG tablet TAKE 1 TABLET BY MOUTH DAILY   No facility-administered medications prior to visit.    Review of Systems  Constitutional:  Positive for fatigue and fever.  HENT:  Positive for congestion, hearing loss, postnasal drip, rhinorrhea, sore throat and voice change. Negative for sinus pressure, sinus pain, sneezing and tinnitus.   Eyes:  Positive for discharge and redness.  Respiratory:  Positive for cough, shortness of breath and wheezing.   Gastrointestinal:  Negative for abdominal pain, constipation, diarrhea, nausea and vomiting.  Neurological:  Positive for headaches. Negative for dizziness and light-headedness.  All other systems reviewed and are negative.  Except see HPI      Objective    BP 138/74 (BP Location: Right Arm, Patient Position: Sitting, Cuff Size: Normal)   Pulse 63   Temp 99.2 F (37.3 C) (Oral)   Wt 152 lb (68.9 kg)   SpO2 98%   BMI 28.72 kg/m    Physical Exam Vitals reviewed.  Constitutional:      General: She is not in acute distress.    Appearance: Normal appearance. She is well-developed. She is not diaphoretic.  HENT:     Head: Normocephalic and atraumatic.     Nose: Congestion and rhinorrhea present.     Mouth/Throat:     Pharynx: Posterior oropharyngeal erythema present.     Comments: Postnasal drainage Eyes:     General: No scleral icterus.       Right eye: No discharge.        Left  eye: No discharge.     Extraocular Movements: Extraocular movements intact.     Conjunctiva/sclera: Conjunctivae normal.     Pupils: Pupils are equal, round, and reactive to light.  Neck:     Thyroid: No thyromegaly.  Cardiovascular:     Rate and Rhythm: Normal rate and regular rhythm.     Pulses: Normal pulses.     Heart sounds: Normal heart sounds. No murmur heard. Pulmonary:     Effort: Pulmonary effort is normal. No respiratory distress.     Breath sounds: Wheezing and rhonchi present.  Musculoskeletal:     Cervical back: Neck supple.     Right lower leg: No edema.     Left lower leg: No edema.  Lymphadenopathy:     Cervical: No cervical adenopathy.  Skin:    General: Skin is warm and dry.     Findings: No rash.  Neurological:     Mental Status: She is alert and oriented to person, place, and time. Mental status is at baseline.  Psychiatric:        Mood and Affect: Mood normal.        Behavior: Behavior normal.      No results found for any visits on 04/18/23.  Assessment & Plan    1. Cough, unspecified type Respiratory Symptoms: Presented with cough, wheezing, and some ear pain. No nasal congestion or postnasal drainage. History of similar symptoms last year. Physical exam revealed some wheezing and nasal congestion. -Order chest x-ray to rule out pneumonia or bronchitis. -Advise patient to drink plenty of water and hot tea with honey for symptomatic relief. Use NSAIDS/tylenol for fever, pain control -If symptoms persist or worsen, patient should seek medical attention at an urgent care facility. -Consider prescribing the preferred cough medication depending on the chest x-ray results and the patient's response to the initial treatment plan. - DG Chest 2 View; Future - predniSONE (DELTASONE) 20 MG tablet; Take 1 tablet (20 mg total) by mouth daily with breakfast.  Dispense: 10 tablet; Refill: 0 - azithromycin (ZITHROMAX) 250 MG tablet; Take 2 tablets on day 1, then 1  tablet daily on days 2 through 5  Dispense: 6 tablet; Refill: 0  Acid Reflux: Managed with daily Omeprazole. -Continue Omeprazole as prescribed.   No follow-ups on file.     The patient was advised to call back or seek an in-person evaluation if the symptoms worsen or if the condition fails to improve as anticipated.  I discussed the assessment and treatment plan with the patient. The patient was provided  an opportunity to ask questions and all were answered. The patient agreed with the plan and demonstrated an understanding of the instructions.  I, Debera Lat, PA-C have reviewed all documentation for this visit. The documentation on  04/18/23 for the exam, diagnosis, procedures, and orders are all accurate and complete.  Debera Lat, [facility], MMS [facility] [phone] (phone) [phone] (fax)  [facility]

## 2023-04-18 ENCOUNTER — Other Ambulatory Visit: Payer: Self-pay | Admitting: Family Medicine

## 2023-04-18 ENCOUNTER — Telehealth: Payer: Self-pay

## 2023-04-18 ENCOUNTER — Ambulatory Visit
Admission: RE | Admit: 2023-04-18 | Discharge: 2023-04-18 | Disposition: A | Discharge status: Home / Self Care | Payer: Medicare Other | Source: Ambulatory Visit | Attending: Physician Assistant | Admitting: Physician Assistant

## 2023-04-18 ENCOUNTER — Encounter: Payer: Self-pay | Admitting: Physician Assistant

## 2023-04-18 ENCOUNTER — Telehealth: Payer: Self-pay | Admitting: Family Medicine

## 2023-04-18 ENCOUNTER — Ambulatory Visit
Admission: RE | Admit: 2023-04-18 | Discharge: 2023-04-18 | Disposition: A | Discharge status: Home / Self Care | Payer: Medicare Other | Attending: Physician Assistant | Admitting: Physician Assistant

## 2023-04-18 ENCOUNTER — Ambulatory Visit: Payer: Medicare Other | Admitting: Physician Assistant

## 2023-04-18 VITALS — BP 138/74 | HR 63 | Temp 99.2°F | Wt 152.0 lb

## 2023-04-18 DIAGNOSIS — R059 Cough, unspecified: Secondary | ICD-10-CM | POA: Insufficient documentation

## 2023-04-18 DIAGNOSIS — R0989 Other specified symptoms and signs involving the circulatory and respiratory systems: Secondary | ICD-10-CM | POA: Diagnosis not present

## 2023-04-18 MED ORDER — PREDNISONE 20 MG PO TABS: 20.0000 mg | ORAL_TABLET | Freq: Every day | ORAL | 0 refills | Status: DC

## 2023-04-18 MED ORDER — AZITHROMYCIN 250 MG PO TABS: ORAL_TABLET | ORAL | 0 refills | Status: DC

## 2023-04-18 MED ORDER — AZITHROMYCIN 250 MG PO TABS: ORAL_TABLET | ORAL | 0 refills | Status: AC

## 2023-04-18 NOTE — Telephone Encounter (Signed)
Medication Refill - Medication: Medication was sent to the wrong pharmacy wanting to be sent to the local pharmacy  azithromycin (ZITHROMAX) 250 MG tablet [[id number]]   Has the patient contacted their pharmacy? Yes.   (Agent: If no, request that the patient contact the pharmacy for the refill. If patient does not wish to contact the pharmacy document the reason why and proceed with request.) (Agent: If yes, when and what did the pharmacy advise?)  Preferred Pharmacy (with phone number or street name):  [id number] [place] [address] Phone: [phone]  Fax: [phone]     Has the patient been seen for an appointment in the last year OR does the patient have an upcoming appointment? Yes.    Agent: Please be advised that RX refills may take up to 3 business days. We ask that you follow-up with your pharmacy.

## 2023-04-18 NOTE — Telephone Encounter (Signed)
Requested medications are due for refill today.  no  Requested medications are on the active medications list.  yes  Last refill. today  Future visit scheduled.   yes  Notes to clinic.  Pt states they wanted medication filled at local pharmacy.    Requested Prescriptions  Pending Prescriptions Disp Refills   azithromycin (ZITHROMAX) 250 MG tablet 6 tablet 0    Sig: Take 2 tablets on day 1, then 1 tablet daily on days 2 through 5     Off-Protocol Failed - 04/18/2023  4:12 PM      Failed - Medication not assigned to a protocol, review manually.      Passed - Valid encounter within last 12 months    Recent Outpatient Visits           Today Cough, unspecified type   [facility] Bishop, [place], PA-C   3 months ago Encounter for annual wellness exam in Medicare patient   [facility] Simmons-Robinson, [place], MD   4 months ago Stress incontinence   [facility] [place], [place], MD   8 months ago Insomnia, unspecified type   [facility] Simmons-Robinson, [place], MD   9 months ago Insomnia, unspecified type   [facility] Malva Limes, MD       Future Appointments             In 2 months Simmons-Robinson, Tawanna Cooler, MD [facility]

## 2023-04-18 NOTE — Telephone Encounter (Signed)
Called pt back to advise that medication was called in.

## 2023-04-18 NOTE — Addendum Note (Signed)
Addended by: Osie Bond on: 04/18/2023 05:28 PM   Modules accepted: Orders

## 2023-04-18 NOTE — Telephone Encounter (Signed)
Called on call provider for verbal order to transfer Azithromycin  250mg  6 table taper. Spoke with Dr. Roxan Hockey - Per Dr. Roxan Hockey ok to resend order to local pharmacy

## 2023-04-18 NOTE — Telephone Encounter (Signed)
Call from pt - Pt states that this medication is imperative for pt to have. Pt also states that our office sent Rx to the incorrect pharmacy.

## 2023-04-18 NOTE — Telephone Encounter (Signed)
Pt is calling in because she says her prescription for azithromycin (ZITHROMAX) 250 MG tablet [[id number]] was sent to the wrong Pharmacy. Pt says [facility] was [facility] to [facility] to [id number] [place], [place] - [address] instead of [facility]. Pt is [facility]. Please advise.

## 2023-04-19 ENCOUNTER — Encounter: Payer: Self-pay | Admitting: Physician Assistant

## 2023-04-21 ENCOUNTER — Other Ambulatory Visit: Payer: Self-pay | Admitting: Family Medicine

## 2023-04-21 ENCOUNTER — Telehealth: Payer: Self-pay | Admitting: Family Medicine

## 2023-04-21 ENCOUNTER — Other Ambulatory Visit: Payer: Self-pay | Admitting: Physician Assistant

## 2023-04-21 MED ORDER — HYDROCOD POLI-CHLORPHE POLI ER 10-8 MG/5ML PO SUER: 5.0000 mL | Freq: Two times a day (BID) | ORAL | 0 refills | Status: DC | PRN

## 2023-04-21 NOTE — Telephone Encounter (Signed)
Copied from CRM [id number]. Topic: General - Inquiry >> Apr 21, 2023  1:23 PM Clide Dales wrote: Patient called to get the results of her xray. Please advise.

## 2023-04-21 NOTE — Telephone Encounter (Signed)
Copied from CRM #[id number]. Topic: General - Inquiry >> Apr 21, 2023  1:23 PM Danielle M wrote: Patient called to get the results of her xray. Please advise. 

## 2023-04-21 NOTE — Telephone Encounter (Signed)
Called patient. Reviewed normal xray results.  Patient counseled that cough can take up to 8 weeks to resolve. Patient voiced understanding. She requested refill of medication to help with cough while going on family trip.   Prescribe tussionex .

## 2023-04-22 ENCOUNTER — Encounter: Payer: Medicare Other | Admitting: Dermatology

## 2023-05-06 ENCOUNTER — Ambulatory Visit (INDEPENDENT_AMBULATORY_CARE_PROVIDER_SITE_OTHER): Payer: Medicare Other | Admitting: Dermatology

## 2023-05-06 VITALS — BP 120/63 | HR 70

## 2023-05-06 DIAGNOSIS — L578 Other skin changes due to chronic exposure to nonionizing radiation: Secondary | ICD-10-CM

## 2023-05-06 DIAGNOSIS — D2271 Melanocytic nevi of right lower limb, including hip: Secondary | ICD-10-CM | POA: Diagnosis not present

## 2023-05-06 DIAGNOSIS — L820 Inflamed seborrheic keratosis: Secondary | ICD-10-CM

## 2023-05-06 DIAGNOSIS — Z85828 Personal history of other malignant neoplasm of skin: Secondary | ICD-10-CM

## 2023-05-06 DIAGNOSIS — Z1283 Encounter for screening for malignant neoplasm of skin: Secondary | ICD-10-CM | POA: Diagnosis not present

## 2023-05-06 DIAGNOSIS — D229 Melanocytic nevi, unspecified: Secondary | ICD-10-CM

## 2023-05-06 DIAGNOSIS — L814 Other melanin hyperpigmentation: Secondary | ICD-10-CM

## 2023-05-06 DIAGNOSIS — D489 Neoplasm of uncertain behavior, unspecified: Secondary | ICD-10-CM

## 2023-05-06 DIAGNOSIS — D225 Melanocytic nevi of trunk: Secondary | ICD-10-CM

## 2023-05-06 DIAGNOSIS — W908XXA Exposure to other nonionizing radiation, initial encounter: Secondary | ICD-10-CM

## 2023-05-06 DIAGNOSIS — L821 Other seborrheic keratosis: Secondary | ICD-10-CM

## 2023-05-06 DIAGNOSIS — Z808 Family history of malignant neoplasm of other organs or systems: Secondary | ICD-10-CM

## 2023-05-06 NOTE — Patient Instructions (Addendum)
Recommend [facility] for orthopedic    Biopsy Wound Care Instructions  Leave the original bandage on for 24 hours if possible.  If the bandage becomes soaked or soiled before that time, it is OK to remove it and examine the wound.  A small amount of post-operative bleeding is normal.  If excessive bleeding occurs, remove the bandage, place gauze over the site and apply continuous pressure (no peeking) over the area for 30 minutes. If this does not work, please call our clinic as soon as possible or page your doctor if it is after hours.   Once a day, cleanse the wound with soap and water. It is fine to shower. If a thick crust develops you may use a Q-tip dipped into dilute hydrogen peroxide (mix 1:1 with water) to dissolve it.  Hydrogen peroxide can slow the healing process, so use it only as needed.    After washing, apply petroleum jelly (Vaseline) or an antibiotic ointment if your doctor prescribed one for you, followed by a bandage.    For best healing, the wound should be covered with a layer of ointment at all times. If you are not able to keep the area covered with a bandage to hold the ointment in place, this may mean re-applying the ointment several times a day.  Continue this wound care until the wound has healed and is no longer open.   Itching and mild discomfort is normal during the healing process. However, if you develop pain or severe itching, please call our office.   If you have any discomfort, you can take Tylenol (acetaminophen) or ibuprofen as directed on the bottle. (Please do not take these if you have an allergy to them or cannot take them for another reason).  Some redness, tenderness and white or yellow material in the wound is normal healing.  If the area becomes very sore and red, or develops a thick yellow-green material (pus), it may be infected; please notify us.    If you have stitches, return to clinic as directed to have the stitches removed. You will continue  wound care for 2-3 days after the stitches are removed.   Wound healing continues for up to one year following surgery. It is not unusual to experience pain in the scar from time to time during the interval.  If the pain becomes severe or the scar thickens, you should notify the office.    A slight amount of redness in a scar is expected for the first six months.  After six months, the redness will fade and the scar will soften and fade.  The color difference becomes less noticeable with time.  If there are any problems, return for a post-op surgery check at your earliest convenience.  To improve the appearance of the scar, you can use silicone scar gel, cream, or sheets (such as Mederma or Serica) every night for up to one year. These are available over the counter (without a prescription).  Please call our office at [phone] for any questions or concerns.        Cryotherapy Aftercare  Wash gently with soap and water everyday.   Apply Vaseline and Band-Aid daily until healed.    Recommend starting moisturizer with exfoliant (Urea, Salicylic acid, or Lactic acid) one to two times daily to help smooth rough and bumpy skin.  OTC options include Cetaphil Rough and Bumpy lotion (Urea), Eucerin Roughness Relief lotion or spot treatment cream (Urea), CeraVe SA lotion/cream for Rough and  Bumpy skin (Sal Acid), Gold Bond Rough and Bumpy cream (Sal Acid), and AmLactin 12% lotion/cream (Lactic Acid).  If applying in morning, also apply sunscreen to sun-exposed areas, since these exfoliating moisturizers can increase sensitivity to sun.    Seborrheic Keratosis  What causes seborrheic keratoses? Seborrheic keratoses are harmless, common skin growths that first appear during adult life.  As time goes by, more growths appear.  Some people may develop a large number of them.  Seborrheic keratoses appear on both covered and uncovered body parts.  They are not caused by sunlight.  The tendency to  develop seborrheic keratoses can be inherited.  They vary in color from skin-colored to gray, brown, or even black.  They can be either smooth or have a rough, warty surface.   Seborrheic keratoses are superficial and look as if they were stuck on the skin.  Under the microscope this type of keratosis looks like layers upon layers of skin.  That is why at times the top layer may seem to fall off, but the rest of the growth remains and re-grows.    Treatment Seborrheic keratoses do not need to be treated, but can easily be removed in the office.  Seborrheic keratoses often cause symptoms when they rub on clothing or jewelry.  Lesions can be in the way of shaving.  If they become inflamed, they can cause itching, soreness, or burning.  Removal of a seborrheic keratosis can be accomplished by freezing, burning, or surgery. If any spot bleeds, scabs, or grows rapidly, please return to have it checked, as these can be an indication of a skin cancer.      Melanoma ABCDEs  Melanoma is the most dangerous type of skin cancer, and is the leading cause of death from skin disease.  You are more likely to develop melanoma if you: Have light-colored skin, light-colored eyes, or red or blond hair Spend a lot of time in the sun Tan regularly, either outdoors or in a tanning bed Have had blistering sunburns, especially during childhood Have a close family member who has had a melanoma Have atypical moles or large birthmarks  Early detection of melanoma is key since treatment is typically straightforward and cure rates are extremely high if we catch it early.   The first sign of melanoma is often a change in a mole or a new dark spot.  The ABCDE system is a way of remembering the signs of melanoma.  A for asymmetry:  The two halves do not match. B for border:  The edges of the growth are irregular. C for color:  A mixture of colors are present instead of an even brown color. D for diameter:  Melanomas are  usually (but not always) greater than 6mm - the size of a pencil eraser. E for evolution:  The spot keeps changing in size, shape, and color.  Please check your skin once per month between visits. You can use a small mirror in front and a large mirror behind you to keep an eye on the back side or your body.   If you see any new or changing lesions before your next follow-up, please call to schedule a visit.  Please continue daily skin protection including broad spectrum sunscreen SPF 30+ to sun-exposed areas, reapplying every 2 hours as needed when you're outdoors.   Staying in the shade or wearing long sleeves, sun glasses (UVA+UVB protection) and wide brim hats (4-inch brim around the entire circumference of the hat) are  also recommended for sun protection.    Due to recent changes in healthcare laws, you may see results of your pathology and/or laboratory studies on MyChart before the doctors have had a chance to review them. We understand that in some cases there may be results that are confusing or concerning to you. Please understand that not all results are received at the same time and often the doctors may need to interpret multiple results in order to provide you with the best plan of care or course of treatment. Therefore, we ask that you please give [place] 2 business days to thoroughly review all your results before contacting the office for clarification. Should we see a critical lab result, you will be contacted sooner.   If You Need Anything After Your Visit  If you have any questions or concerns for your doctor, please call our main line at [phone] and press option 4 to reach your doctor's medical assistant. If no one answers, please leave a voicemail as directed and we will return your call as soon as possible. Messages left after 4 pm will be answered the following business day.   You may also send [place] a message via MyChart. We typically respond to MyChart messages within 1-2  business days.  For prescription refills, please ask your pharmacy to contact our office. Our fax number is [phone].  If you have an urgent issue when the clinic is closed that cannot wait until the next business day, you can page your doctor at the number below.    Please note that while we do our best to be available for urgent issues outside of office hours, we are not available 24/7.   If you have an urgent issue and are unable to reach [place], you may choose to seek medical care at your doctor's office, retail clinic, urgent care center, or emergency room.  If you have a medical emergency, please immediately call 911 or go to the emergency department.  Pager Numbers  - Dr. Gwen Pounds: [phone]  - Dr. Neale Burly: [phone]  - Dr. Roseanne Reno: [phone]  In the event of inclement weather, please call our main line at [phone] for an update on the status of any delays or closures.  Dermatology Medication Tips: Please keep the boxes that topical medications come in in order to help keep track of the instructions about where and how to use these. Pharmacies typically print the medication instructions only on the boxes and not directly on the medication tubes.   If your medication is too expensive, please contact our office at [phone] option 4 or send [place] a message through MyChart.   We are unable to tell what your co-pay for medications will be in advance as this is different depending on your insurance coverage. However, we may be able to find a substitute medication at lower cost or fill out paperwork to get insurance to cover a needed medication.   If a prior authorization is required to get your medication covered by your insurance company, please allow [place] 1-2 business days to complete this process.  Drug prices often vary depending on where the prescription is filled and some pharmacies may offer cheaper prices.  The website www.goodrx.com contains coupons for medications  through different pharmacies. The prices here do not account for what the cost may be with help from insurance (it may be cheaper with your insurance), but the website can give you the price if you did not use any insurance.  - You  can print the associated coupon and take it with your prescription to the pharmacy.  - You may also stop by our office during regular business hours and pick up a GoodRx coupon card.  - If you need your prescription sent electronically to a different pharmacy, notify our office through [facility] or by phone at [phone] option 4.     Si Usted Necesita Algo Despus de Su Visita  Tambin puede enviarnos un mensaje a travs de Clinical cytogeneticist. Por lo general respondemos a los mensajes de MyChart en el transcurso de 1 a 2 das hbiles.  Para renovar recetas, por favor pida a su farmacia que se ponga en contacto con nuestra oficina. Annie Sable de fax es [place] [phone].  Si tiene un asunto urgente cuando la clnica est cerrada y que no puede esperar hasta el siguiente da hbil, puede llamar/localizar a su doctor(a) al nmero que aparece a continuacin.   Por favor, tenga en cuenta que aunque hacemos todo lo posible para estar disponibles para asuntos urgentes fuera del horario de [place], no estamos disponibles las 24 horas del da, los 7 [address]  [address] de la [place].   Si tiene un problema urgente y no puede comunicarse con nosotros, puede optar por buscar atencin mdica  en el consultorio de su doctor(a), en una clnica privada, en un centro de atencin urgente o en una sala de emergencias.  Si tiene Engineer, drilling, por favor llame inmediatamente al 911 o vaya a la sala de emergencias.  Nmeros de bper  - Dr. Gwen Pounds: [phone]  - Dra. Moye: [phone]  - Dra. Roseanne Reno: [phone]  En caso de inclemencias del [place], por favor llame a Lacy Duverney principal al [phone] para una actualizacin sobre el [place] de cualquier retraso o  cierre.  Consejos para la medicacin en dermatologa: Por favor, guarde las cajas en las que vienen los medicamentos de uso tpico para ayudarle a seguir las instrucciones sobre dnde y cmo usarlos. Las farmacias generalmente imprimen las instrucciones del medicamento slo en las cajas y no directamente en los tubos del [place].   Si su medicamento es muy caro, por favor, pngase en contacto con Rolm Gala llamando al [phone] y presione la opcin 4 o envenos un mensaje a travs de Clinical cytogeneticist.   No podemos decirle cul ser su copago por los medicamentos por adelantado ya que esto es diferente dependiendo de la cobertura de su seguro. Sin embargo, es posible que podamos encontrar un medicamento sustituto a Audiological scientist un formulario para que el seguro cubra el medicamento que se considera necesario.   Si se requiere una autorizacin previa para que su compaa de seguros [place] su medicamento, por favor permtanos de 1 a 2 das hbiles para completar [address]  Los precios de los medicamentos varan con frecuencia dependiendo del Environmental consultant de dnde se surte la receta y alguna farmacias pueden ofrecer precios ms baratos.  El sitio web www.goodrx.com tiene cupones para medicamentos de Health and safety inspector. Los precios aqu no tienen en cuenta lo que podra costar con la ayuda del seguro (puede ser ms barato con su seguro), pero el sitio web puede darle el precio si no utiliz Tourist information centre manager.  - Puede imprimir el cupn correspondiente y llevarlo con su receta a la farmacia.  - Tambin puede pasar por nuestra oficina durante el horario de atencin regular y Education officer, museum una tarjeta de cupones de GoodRx.  - Si necesita que su receta se enve electrnicamente a una farmacia diferente, informe a [place]  oficina a travs de MyChart [place] o por telfono llamando al [phone] y presione la opcin 4.

## 2023-05-06 NOTE — Progress Notes (Signed)
Follow-Up Visit   Subjective  Grace Wheeler is a 77 y.o. female who presents for the following: Skin Cancer Screening and Full Body Skin Exam Reports some spots at scalp, flaky dry spots at face and forehead, and irritated spot at right inner thigh.    The patient presents for Total-Body Skin Exam (TBSE) for skin cancer screening and mole check. The patient has spots, moles and lesions to be evaluated, some may be new or changing and the patient has concerns that these could be cancer.  The following portions of the chart were reviewed this encounter and updated as appropriate: medications, allergies, medical history  Review of Systems:  No other skin or systemic complaints except as noted in HPI or Assessment and Plan.  Objective  Well appearing patient in no apparent distress; mood and affect are within normal limits.  A full examination was performed including scalp, head, eyes, ears, nose, lips, neck, chest, axillae, abdomen, back, buttocks, bilateral upper extremities, bilateral lower extremities, hands, feet, fingers, toes, fingernails, and toenails. All findings within normal limits unless otherwise noted below.   Relevant physical exam findings are noted in the Assessment and Plan.  left crown x 1, Erythematous stuck-on, waxy papule  Right Medial Thigh 4 mm flesh papule          Assessment & Plan   LENTIGINES, SEBORRHEIC KERATOSES, HEMANGIOMAS - Benign normal skin lesions - Benign-appearing - Call for any changes Sks at forehead recommend Cerave renewing SA cream - samples given Sk right upper arm   MELANOCYTIC NEVI - Tan-brown and/or pink-flesh-colored symmetric macules and papules at back Regular brown macules at buttocks - Nevus at right lower back 4 mm brown macule  - Benign appearing on exam today - Observation - Call clinic for new or changing moles - Recommend daily use of broad spectrum spf 30+ sunscreen to sun-exposed areas.   ACTINIC DAMAGE -  Chronic condition, secondary to cumulative UV/sun exposure - diffuse scaly erythematous macules with underlying dyspigmentation - Recommend daily broad spectrum sunscreen SPF 30+ to sun-exposed areas, reapply every 2 hours as needed.  - Staying in the shade or wearing long sleeves, sun glasses (UVA+UVB protection) and wide brim hats (4-inch brim around the entire circumference of the hat) are also recommended for sun protection.  - Call for new or changing lesions.  HISTORY OF BASAL CELL CARCINOMA OF THE SKIN Left upper arm 10/2022 2 mm indistinct light pink papule at medial scar, recheck on f/u - No evidence of recurrence today - Recommend regular full body skin exams - Recommend daily broad spectrum sunscreen SPF 30+ to sun-exposed areas, reapply every 2 hours as needed.  - Call if any new or changing lesions are noted between office visits  Family history of skin cancer - what type(s): Melanoma - who affected: Mother  SKIN CANCER SCREENING PERFORMED TODAY.  Inflamed seborrheic keratosis left crown x 1,  Symptomatic, irritating, patient would like treated.  Destruction of lesion - left crown x 1,  Destruction method: cryotherapy   Informed consent: discussed and consent obtained   Lesion destroyed using liquid nitrogen: Yes   Region frozen until ice ball extended beyond lesion: Yes   Outcome: patient tolerated procedure well with no complications   Post-procedure details: wound care instructions given   Additional details:  Prior to procedure, discussed risks of blister formation, small wound, skin dyspigmentation, or rare scar following cryotherapy. Recommend Vaseline ointment to treated areas while healing.   Neoplasm of uncertain behavior Right Medial  Thigh  Epidermal / dermal shaving  Lesion diameter (cm):  0.4 Informed consent: discussed and consent obtained   Patient was prepped and draped in usual sterile fashion: Area prepped with alcohol. Anesthesia: the lesion  was anesthetized in a standard fashion   Anesthetic:  1% lidocaine w/ epinephrine 1-100,000 buffered w/ 8.4% NaHCO3 Instrument used: flexible razor blade   Hemostasis achieved with: pressure, aluminum chloride and electrodesiccation   Outcome: patient tolerated procedure well   Post-procedure details: wound care instructions given   Post-procedure details comment:  Ointment and small bandage applied.   Specimen 1 - Surgical pathology Differential Diagnosis: Irritated skin tag vs irritated nevus  Check Margins: No  Irritated skin tag vs irritated nevus   Return in about 1 year (around 05/05/2024) for TBSE.  I, Asher Muir, CMA, am acting as scribe for Willeen Niece, MD.   Documentation: I have reviewed the above documentation for accuracy and completeness, and I agree with the above.  Willeen Niece, MD

## 2023-05-14 ENCOUNTER — Telehealth: Payer: Self-pay

## 2023-05-14 DIAGNOSIS — M25562 Pain in left knee: Secondary | ICD-10-CM

## 2023-05-14 HISTORY — DX: Pain in left knee: M25.562

## 2023-05-14 NOTE — Telephone Encounter (Signed)
Left pt message to call for bx results/sh °

## 2023-05-14 NOTE — Telephone Encounter (Signed)
Patient advised of BX results .aw 

## 2023-05-14 NOTE — Telephone Encounter (Signed)
-----   Message from Willeen Niece, MD sent at 05/13/2023  7:18 PM EDT ----- Skin , right medial thigh MELANOCYTIC NEVUS, INTRADERMAL TYPE  Benign mole - please call patient

## 2023-05-16 DIAGNOSIS — S83209A Unspecified tear of unspecified meniscus, current injury, unspecified knee, initial encounter: Secondary | ICD-10-CM

## 2023-05-16 HISTORY — DX: Unspecified tear of unspecified meniscus, current injury, unspecified knee, initial encounter: S83.209A

## 2023-05-23 ENCOUNTER — Other Ambulatory Visit: Payer: Self-pay | Admitting: Family Medicine

## 2023-05-23 NOTE — Telephone Encounter (Signed)
Requested Prescriptions  Pending Prescriptions Disp Refills   hydrochlorothiazide (HYDRODIURIL) 25 MG tablet [Pharmacy Med Name: hydroCHLOROthiazide 25 MG Oral Tablet] 90 tablet 1    Sig: TAKE 1 TABLET BY MOUTH DAILY     Cardiovascular: Diuretics - Thiazide Passed - 05/23/2023  6:22 AM      Passed - Cr in normal range and within 180 days    Creatinine, Ser  Date Value Ref Range Status  12/04/2022 0.89 0.57 - 1.00 mg/dL Final         Passed - K in normal range and within 180 days    Potassium  Date Value Ref Range Status  12/04/2022 3.8 3.5 - 5.2 mmol/L Final         Passed - Na in normal range and within 180 days    Sodium  Date Value Ref Range Status  12/04/2022 143 134 - 144 mmol/L Final         Passed - Last BP in normal range    BP Readings from Last 1 Encounters:  05/06/23 120/63         Passed - Valid encounter within last 6 months    Recent Outpatient Visits           1 month ago Cough, unspecified type   [facility] [place], [place], PA-C   4 months ago Encounter for annual wellness exam in Medicare patient   [place] [facility] Simmons-Robinson, [place], MD   5 months ago Stress incontinence   [place] [facility] [place], [place], MD   9 months ago Insomnia, unspecified type   [place] Simmons-Robinson, [place], MD   10 months ago Insomnia, unspecified type   [facility] Malva Limes, MD       Future Appointments             In 1 month Simmons-Robinson, Tawanna Cooler, MD [facility], [facility]   In 1 year Willeen Niece, MD [facility]

## 2023-05-28 ENCOUNTER — Other Ambulatory Visit: Payer: Self-pay | Admitting: Family Medicine

## 2023-05-28 DIAGNOSIS — G4700 Insomnia, unspecified: Secondary | ICD-10-CM

## 2023-05-29 NOTE — Telephone Encounter (Signed)
Requested medication (s) are due for refill today - yes  Requested medication (s) are on the active medication list -yes  Future visit scheduled -yes  Last refill: 12/04/22 #90 1RF  Notes to clinic: non delegated Rx  Requested Prescriptions  Pending Prescriptions Disp Refills   clonazePAM (KLONOPIN) 1 MG tablet [Pharmacy Med Name: CLONAZEPAM  1MG   TAB] 90 tablet     Sig: TAKE 1 TABLET BY MOUTH AT  BEDTIME     Not Delegated - Psychiatry: Anxiolytics/Hypnotics 2 Failed - 05/28/2023 11:44 AM      Failed - This refill cannot be delegated      Failed - Urine Drug Screen completed in last 360 days      Passed - Patient is not pregnant      Passed - Valid encounter within last 6 months    Recent Outpatient Visits           1 month ago Cough, unspecified type   [place] [facility] [place], [place], PA-C   5 months ago Encounter for annual wellness exam in Medicare patient   [place] [facility] Simmons-Robinson, [place], MD   5 months ago Stress incontinence   [place] [facility] Simmons-Robinson, [place], MD   10 months ago Insomnia, unspecified type   [place] [facility] Simmons-Robinson, [place], MD   11 months ago Insomnia, unspecified type   [facility] Malva Limes, MD       Future Appointments             In 3 weeks Simmons-Robinson, Tawanna Cooler, MD [facility], [facility]   In 12 months Willeen Niece, MD [facility]               Requested Prescriptions  Pending Prescriptions Disp Refills   clonazePAM (KLONOPIN) 1 MG tablet [Pharmacy Med Name: CLONAZEPAM  1MG   TAB] 90 tablet     Sig: TAKE 1 TABLET BY MOUTH AT  BEDTIME     Not Delegated - Psychiatry: Anxiolytics/Hypnotics 2 Failed - 05/28/2023 11:44 AM      Failed - This refill cannot be delegated      Failed - Urine Drug Screen completed in last 360 days      Passed  - Patient is not pregnant      Passed - Valid encounter within last 6 months    Recent Outpatient Visits           1 month ago Cough, unspecified type   [place] [facility] [place], [place], PA-C   5 months ago Encounter for annual wellness exam in Medicare patient   [place] [facility] [place], [place], MD   5 months ago Stress incontinence   [place] [facility] [place], [place], MD   10 months ago Insomnia, unspecified type   [place] [facility] Simmons-Robinson, [place], MD   11 months ago Insomnia, unspecified type   [facility] Malva Limes, MD       Future Appointments             In 3 weeks Simmons-Robinson, Tawanna Cooler, MD [facility], [facility]   In 12 months Willeen Niece, MD [facility]              b

## 2023-06-02 ENCOUNTER — Other Ambulatory Visit: Payer: Self-pay

## 2023-06-04 DIAGNOSIS — M1711 Unilateral primary osteoarthritis, right knee: Secondary | ICD-10-CM

## 2023-06-04 HISTORY — DX: Unilateral primary osteoarthritis, right knee: M17.11

## 2023-06-10 ENCOUNTER — Ambulatory Visit: Payer: Medicare Other | Admitting: Dermatology

## 2023-06-20 LAB — HM DIABETES EYE EXAM

## 2023-06-24 ENCOUNTER — Encounter: Payer: Self-pay | Admitting: Family Medicine

## 2023-06-25 ENCOUNTER — Ambulatory Visit: Payer: Medicare Other | Admitting: Family Medicine

## 2023-06-25 ENCOUNTER — Encounter: Payer: Self-pay | Admitting: Family Medicine

## 2023-06-25 VITALS — BP 119/61 | HR 65 | Temp 98.2°F | Resp 12 | Ht 61.0 in | Wt 153.9 lb

## 2023-06-25 DIAGNOSIS — I10 Essential (primary) hypertension: Secondary | ICD-10-CM | POA: Diagnosis not present

## 2023-06-25 DIAGNOSIS — F5104 Psychophysiologic insomnia: Secondary | ICD-10-CM

## 2023-06-25 DIAGNOSIS — J3489 Other specified disorders of nose and nasal sinuses: Secondary | ICD-10-CM | POA: Insufficient documentation

## 2023-06-25 DIAGNOSIS — K219 Gastro-esophageal reflux disease without esophagitis: Secondary | ICD-10-CM | POA: Diagnosis not present

## 2023-06-25 DIAGNOSIS — E785 Hyperlipidemia, unspecified: Secondary | ICD-10-CM

## 2023-06-25 DIAGNOSIS — Z0000 Encounter for general adult medical examination without abnormal findings: Secondary | ICD-10-CM | POA: Insufficient documentation

## 2023-06-25 DIAGNOSIS — E663 Overweight: Secondary | ICD-10-CM | POA: Insufficient documentation

## 2023-06-25 MED ORDER — FLUTICASONE PROPIONATE 50 MCG/ACT NA SUSP: 2.0000 | Freq: Every day | NASAL | 6 refills | Status: DC

## 2023-06-25 NOTE — Assessment & Plan Note (Signed)
Chronic  Stable  Continue klonopin 1mg  at bedtime for insomnia

## 2023-06-25 NOTE — Assessment & Plan Note (Signed)
Patient reports significant weight gain since colon resection in December 2022, causing distress. -Order thyroid function tests to rule out hypothyroidism as a potential cause. -Encourage continued physical activity as tolerated and balanced diet.

## 2023-06-25 NOTE — Assessment & Plan Note (Signed)
Chronic No record of previous cholesterol levels is available  She will continue current medication including rosuvastatin 10mg  daily

## 2023-06-25 NOTE — Progress Notes (Signed)
Established patient visit   Patient: Grace Wheeler   DOB: 1946-05-13   77 y.o. Female  MRN: [id number] Visit Date: 06/25/2023  Today's healthcare provider: [name], MD   Chief Complaint  Patient presents with  . Medical Management of Chronic Issues    Patient reports good compliance and tolerance to medications. She is not checking her BP at home. Patient reports taking clonazepam 1 mg tablet every night for sleep.  She reports she will have knee surgery on 07/31/23 with Dr. Cassell Smiles.    Subjective     HPI     Medical Management of Chronic Issues    Additional comments: Patient reports good compliance and tolerance to medications. She is not checking her BP at home. Patient reports taking clonazepam 1 mg tablet every night for sleep.  She reports she will have knee surgery on 07/31/23 with Dr. Cassell Smiles.       Last edited by Myles Lipps, CMA on 06/25/2023  1:10 PM.       Discussed the use of AI scribe software for clinical note transcription with the patient, who gave verbal consent to proceed.  History of Present Illness   The patient, with a history of Sjogren's syndrome, presents with concerns about upcoming bilateral knee replacement surgeries due to torn menisci in both knees. The patient reports feeling anxious and depressed due to the anticipated surgeries and the impact on their mobility. The patient also reports a constant nasal drip, which is unusual given their Sjogren's syndrome, which typically causes dryness in various parts of the body.  The patient also reports a persistent cough since a bout of bronchitis a few months ago. They are concerned about the state of their lungs prior to the knee surgeries and would like to ensure their chest is clear.  Additionally, the patient has noticed significant weight gain since a colon resection in December of the previous year. They report a weight gain of approximately 15 pounds, which has  resulted in the need for larger clothing. The patient denies any changes in appetite or eating habits and is concerned about the impact of this weight gain on their overall health and upcoming surgeries.  The patient also reports limited mobility and activity due to knee pain, which has resulted in a mostly sedentary lifestyle over the summer. They express concern about the impact of this lack of activity on their weight and overall health.  The patient is currently on multiple medications, including Plaquenil for Sjogren's syndrome, metoprolol and hydrochlorothiazide for blood pressure control, gabapentin for nerve pain, and Celebrex for knee inflammation. They have recently discontinued Prilosec for reflux due to a lack of symptoms. The patient is also on Klonopin for sleep and Crestor for cholesterol management. They have had no adverse reactions to these medications.  The patient has a history of multiple surgeries, but expresses concern about the upcoming knee replacements due to the need for disciplined post-operative exercises and the fact that these surgeries involve the insertion of foreign materials, unlike their previous surgeries. Despite these concerns, the patient is eager to proceed with the surgeries to improve their mobility and quality of life.         Medications: Outpatient Medications Prior to Visit  Medication Sig Note  . acetaminophen (TYLENOL) 650 MG CR tablet Tylenol Arthritis Pain 650 mg tablet,extended release  Take 1 tablet twice a day by oral route.   . celecoxib (CELEBREX) 100 MG capsule Take 1 capsule  by mouth 2 (two) times daily.   . clonazePAM (KLONOPIN) 1 MG tablet TAKE 1 TABLET BY MOUTH AT  BEDTIME   . diclofenac Sodium (VOLTAREN) 1 % GEL diclofenac 1 % topical gel  APPLY 2 GRAM TO THE AFFECTED AREA(S) BY TOPICAL ROUTE 4 TIMES PER DAY   . gabapentin (NEURONTIN) 100 MG capsule Take 1 capsule by mouth 2 (two) times daily.   . hydrochlorothiazide (HYDRODIURIL) 25  MG tablet TAKE 1 TABLET BY MOUTH DAILY   . hydroxychloroquine (PLAQUENIL) 200 MG tablet hydroxychloroquine 200 mg tablet  TAKE 1 TABLET BY MOUTH  DAILY   . Light Mineral Oil-Mineral Oil 0.5-0.5 % EMUL Retaine MGD (PF) 0.5 %-0.5 % eye drops in a dropperette  As Needed   . metoprolol succinate (TOPROL-XL) 50 MG 24 hr tablet metoprolol succinate ER 50 mg tablet,extended release 24 hr  TAKE 1 TABLET BY MOUTH AT  BEDTIME   . rosuvastatin (CRESTOR) 10 MG tablet TAKE 1 TABLET BY MOUTH DAILY   . [DISCONTINUED] omeprazole (PRILOSEC) 40 MG capsule TAKE 1 CAPSULE BY MOUTH DAILY 06/25/2023: pt preference, denies symptoms   No facility-administered medications prior to visit.    Review of Systems      Objective    BP 119/61 (BP Location: Left Arm, Patient Position: Sitting, Cuff Size: Large)   Pulse 65   Temp 98.2 F (36.8 C) (Temporal)   Resp 12   Ht 5\' 1"  (1.549 m)   Wt 153 lb 14.4 oz (69.8 kg)   SpO2 96%   BMI 29.08 kg/m     Physical Exam Vitals reviewed.  Constitutional:      General: She is not in acute distress.    Appearance: Normal appearance. She is not ill-appearing, toxic-appearing or diaphoretic.     Comments: Elderly female, pleasant demeanor, ambulating with walker  Eyes:     Conjunctiva/sclera: Conjunctivae normal.  Cardiovascular:     Rate and Rhythm: Normal rate and regular rhythm.     Pulses: Normal pulses.     Heart sounds: Normal heart sounds. No murmur heard.    No friction rub. No gallop.  Pulmonary:     Effort: Pulmonary effort is normal. No respiratory distress.     Breath sounds: Normal breath sounds. No stridor. No wheezing, rhonchi or rales.  Abdominal:     General: Bowel sounds are normal. There is no distension.     Palpations: Abdomen is soft.     Tenderness: There is no abdominal tenderness.  Musculoskeletal:     Right lower leg: No edema.     Left lower leg: No edema.  Skin:    Findings: No erythema or rash.  Neurological:     Mental Status:  She is alert and oriented to person, place, and time.      No results found for any visits on 06/25/23.  Assessment & Plan     Problem List Items Addressed This Visit     Chronic insomnia    Chronic  Stable  Continue klonopin 1mg  at bedtime for insomnia       Dyslipidemia    Chronic No record of previous cholesterol levels is available  She will continue current medication including rosuvastatin 10mg  daily        Gastroesophageal reflux disease    Chronic  Symptoms well controlled on no medications for now  DC omeprazole as pt has not been taking this for several months       Healthcare maintenance   Overweight (BMI 25.0-29.9)  Patient reports significant weight gain since colon resection in December 2022, causing distress. -Order thyroid function tests to rule out hypothyroidism as a potential cause. -Encourage continued physical activity as tolerated and balanced diet.      Relevant Orders   TSH + free T4   Primary hypertension - Primary    Controlled BP at goal Continue hydrochlorothiazide 25mg  once daily and metoprolol 50mg  daily  No medications changes today        Rhinorrhea   Relevant Medications   fluticasone (FLONASE) 50 MCG/ACT nasal spray        Bilateral Knee Osteoarthritis with Torn Meniscus Patient reports bilateral knee pain due to torn meniscus and is scheduled for bilateral knee replacement surgery. Expressed anxiety and fear regarding post-operative rehabilitation. -Continue current management plan and provide reassurance regarding post-operative care.  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 However, patient reports paradoxical persistent nasal drip. -Start Flonase nasal spray, two sprays once daily. -Reevaluate in 4-6 weeks for efficacy.  Post-Bronchitis Cough Patient reports occasional deep cough following a bronchitis episode a few months ago. -Chest auscultation revealed clear lungs bilaterally. -Continue  monitoring symptoms.     Preoperative Evaluation Patient is scheduled for bilateral knee replacement surgery. -Ensure completion of preoperative labs ordered by surgery -Deem patient medically optimized for surgery based on current health status. -low risk of adverse cardiovascular event perioperatively, cardiac risk index score reviewed   General Health Maintenance -Ensure patient is up-to-date with tetanus vaccination. -Follow-up in 4 months or sooner if needed.         Return in about 4 months (around 10/25/2023) for HTN, CHRONIC F/U.         [name], MD  [facility] [phone] (phone) [phone] (fax)  [facility]

## 2023-06-25 NOTE — Assessment & Plan Note (Signed)
Chronic  Symptoms well controlled on no medications for now  DC omeprazole as pt has not been taking this for several months

## 2023-06-25 NOTE — Assessment & Plan Note (Signed)
Controlled BP at goal Continue hydrochlorothiazide 25mg  once daily and metoprolol 50mg  daily  No medications changes today

## 2023-06-26 LAB — TSH+FREE T4
Free T4: 1.16 ng/dL (ref 0.82–1.77)
TSH: 2.77 u[IU]/mL (ref 0.450–4.500)

## 2023-07-31 HISTORY — PX: REPLACEMENT TOTAL KNEE: SUR1224

## 2023-08-07 DIAGNOSIS — Z9889 Other specified postprocedural states: Secondary | ICD-10-CM

## 2023-08-07 HISTORY — DX: Other specified postprocedural states: Z98.890

## 2023-08-11 DIAGNOSIS — Z96659 Presence of unspecified artificial knee joint: Secondary | ICD-10-CM | POA: Insufficient documentation

## 2023-08-11 HISTORY — DX: Presence of unspecified artificial knee joint: Z96.659

## 2023-08-18 ENCOUNTER — Telehealth: Payer: Self-pay | Admitting: Family Medicine

## 2023-08-18 NOTE — Telephone Encounter (Signed)
[facility] faxed refill request for the following medications:  clonazePAM (KLONOPIN) 1 MG tablet   Please advise.

## 2023-08-19 ENCOUNTER — Other Ambulatory Visit: Payer: Self-pay

## 2023-08-19 DIAGNOSIS — G4700 Insomnia, unspecified: Secondary | ICD-10-CM

## 2023-08-19 MED ORDER — CLONAZEPAM 1 MG PO TABS: 1.0000 mg | ORAL_TABLET | Freq: Every day | ORAL | 0 refills | Status: DC

## 2023-11-08 ENCOUNTER — Other Ambulatory Visit: Payer: Self-pay | Admitting: Family Medicine

## 2023-11-08 DIAGNOSIS — G4700 Insomnia, unspecified: Secondary | ICD-10-CM

## 2023-11-19 ENCOUNTER — Encounter: Payer: Self-pay | Admitting: Family Medicine

## 2023-11-19 ENCOUNTER — Ambulatory Visit: Payer: Medicare Other | Admitting: Family Medicine

## 2023-11-19 VITALS — BP 135/65 | HR 76 | Ht 60.0 in | Wt 146.8 lb

## 2023-11-19 DIAGNOSIS — I700 Atherosclerosis of aorta: Secondary | ICD-10-CM

## 2023-11-19 DIAGNOSIS — M3500 Sicca syndrome, unspecified: Secondary | ICD-10-CM

## 2023-11-19 DIAGNOSIS — S0990XA Unspecified injury of head, initial encounter: Secondary | ICD-10-CM | POA: Diagnosis not present

## 2023-11-19 DIAGNOSIS — J301 Allergic rhinitis due to pollen: Secondary | ICD-10-CM | POA: Diagnosis not present

## 2023-11-19 DIAGNOSIS — K760 Fatty (change of) liver, not elsewhere classified: Secondary | ICD-10-CM

## 2023-11-19 DIAGNOSIS — F5104 Psychophysiologic insomnia: Secondary | ICD-10-CM

## 2023-11-19 DIAGNOSIS — I10 Essential (primary) hypertension: Secondary | ICD-10-CM

## 2023-11-19 DIAGNOSIS — E785 Hyperlipidemia, unspecified: Secondary | ICD-10-CM

## 2023-11-19 DIAGNOSIS — R519 Headache, unspecified: Secondary | ICD-10-CM

## 2023-11-19 DIAGNOSIS — M8580 Other specified disorders of bone density and structure, unspecified site: Secondary | ICD-10-CM

## 2023-11-19 DIAGNOSIS — M25511 Pain in right shoulder: Secondary | ICD-10-CM

## 2023-11-19 DIAGNOSIS — M542 Cervicalgia: Secondary | ICD-10-CM

## 2023-11-19 DIAGNOSIS — Z1231 Encounter for screening mammogram for malignant neoplasm of breast: Secondary | ICD-10-CM

## 2023-11-19 DIAGNOSIS — E559 Vitamin D deficiency, unspecified: Secondary | ICD-10-CM

## 2023-11-19 DIAGNOSIS — K219 Gastro-esophageal reflux disease without esophagitis: Secondary | ICD-10-CM

## 2023-11-19 DIAGNOSIS — M797 Fibromyalgia: Secondary | ICD-10-CM

## 2023-11-19 MED ORDER — GABAPENTIN 100 MG PO CAPS: 300.0000 mg | ORAL_CAPSULE | Freq: Two times a day (BID) | ORAL | 3 refills | Status: DC

## 2023-11-19 NOTE — Progress Notes (Signed)
 Established patient visit   Patient: Grace Wheeler   DOB: 10-01-1946   78 y.o. Female  MRN: [id number] Visit Date: 11/19/2023  Today's healthcare provider: Mimi Alt, MD   Chief Complaint  Patient presents with   Medical Management of Chronic Issues    4 month f/u   Hyperlipidemia   Hypertension   Immunizations    Declined pneumonia vaccine today   Subjective     HPI     Medical Management of Chronic Issues    Additional comments: 4 month f/u        Immunizations    Additional comments: Declined pneumonia vaccine today        Comments   Pt report Friday evening when leaving home she had plaque fall on her head. She has since then been having headaches, blurred vision, pain in neck radiating down shoulder and back. Patient reports she has been spraying it with asper creme with lidocaine and taking extra strength tylenol       Last edited by Pasty Bongo, CMA on 11/19/2023  1:11 PM.       Discussed the use of AI scribe software for clinical note transcription with the patient, who gave verbal consent to proceed.  History of Present Illness   The patient is a 78 year old individual with a complex medical history including insomnia, gastric reflux, hypertension, Sjogren's syndrome, hyperlipidemia, aortic atherosclerosis, non-alcoholic fatty liver disease, fibromyalgia, osteopenia, vitamin D  deficiency, Raynaud's disease, stress incontinence, and neuropathy. The patient presented for a routine follow-up, primarily for fibromyalgia management.  The patient reported a recent incident of head trauma, which occurred when a plaque fell from a door and struck the top of her head. Since the incident, the patient has experienced persistent headaches, blurred vision, and pain radiating down the right side of the neck, shoulder, and arm. The patient has been managing the pain with Tylenol  and a topical aspirin with lidocaine spray, but reports the pain as a  constant dull ache that has been disruptive to sleep.  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 The other knee is also problematic, but the patient is hesitant to pursue another replacement surgery due to the previous negative experience.  The patient's ongoing management of multiple chronic conditions was also discussed. The patient is currently on a regimen of Tylenol , Celebrex, Klonopin , diclofenac gel, fluticasone , gabapentin , hydrochlorothiazide , Plaquenil, and metoprolol. The patient also takes Crestor  for hyperlipidemia and is due for an updated lipid panel.  The patient's Sjogren's syndrome is managed by a rheumatologist, and the patient reports stable symptoms of dry eyes and mouth while on Plaquenil. The patient also has a history of osteopenia and is due for a bone density test. The patient has non-alcoholic fatty liver disease, which is monitored through imaging and liver enzyme levels.  The patient's insomnia is managed with Klonopin , but the patient expressed dissatisfaction with the effectiveness of this medication and a desire to try other options. The patient has a history of trying various medications for insomnia, including trazodone and Restoril, but reported negative side effects or insufficient relief from these medications.      Past Medical History:  Diagnosis Date   Arthritis    Autoimmune disease (HCC)    multiple autoimmune system problems   Basal cell carcinoma 10/23/2022   Left upper arm. Nodular, infiltrative type. EDC   Blood transfusion without reported diagnosis  Fibromyalgia    GERD (gastroesophageal reflux disease)    Hypertension    Lumbar spondylitis (HCC)    Raynaud's disease    Sjogren's disease (HCC)    TMJ (dislocation of temporomandibular joint)     Medications: Outpatient Medications Prior to Visit  Medication Sig   acetaminophen  (TYLENOL ) 650  MG CR tablet Tylenol  Arthritis Pain 650 mg tablet,extended release  Take 1 tablet twice a day by oral route.   clonazePAM  (KLONOPIN ) 1 MG tablet TAKE 1 TABLET BY MOUTH AT  BEDTIME   diclofenac Sodium (VOLTAREN) 1 % GEL diclofenac 1 % topical gel  APPLY 2 GRAM TO THE AFFECTED AREA(S) BY TOPICAL ROUTE 4 TIMES PER DAY   fluticasone  (FLONASE ) 50 MCG/ACT nasal spray Place 2 sprays into both nostrils daily.   hydrochlorothiazide  (HYDRODIURIL ) 25 MG tablet TAKE 1 TABLET BY MOUTH DAILY   hydroxychloroquine (PLAQUENIL) 200 MG tablet hydroxychloroquine 200 mg tablet  TAKE 1 TABLET BY MOUTH  DAILY   Light Mineral Oil-Mineral Oil 0.5-0.5 % EMUL Retaine MGD (PF) 0.5 %-0.5 % eye drops in a dropperette  As Needed   metoprolol succinate (TOPROL-XL) 50 MG 24 hr tablet metoprolol succinate ER 50 mg tablet,extended release 24 hr  TAKE 1 TABLET BY MOUTH AT  BEDTIME   rosuvastatin  (CRESTOR ) 10 MG tablet TAKE 1 TABLET BY MOUTH DAILY   [DISCONTINUED] gabapentin  (NEURONTIN ) 100 MG capsule Take 1 capsule by mouth 2 (two) times daily.   [DISCONTINUED] celecoxib (CELEBREX) 100 MG capsule Take 1 capsule by mouth 2 (two) times daily. (Patient not taking: Reported on 11/19/2023)   No facility-administered medications prior to visit.    Review of Systems  Last metabolic panel Lab Results  Component Value Date   GLUCOSE 92 12/04/2022   NA 143 12/04/2022   K 3.8 12/04/2022   CL 101 12/04/2022   CO2 27 12/04/2022   BUN 13 12/04/2022   CREATININE 0.89 12/04/2022   EGFR 68 12/04/2022   CALCIUM  9.8 12/04/2022   PROT 7.0 03/15/2022   ALBUMIN 3.8 03/15/2022   BILITOT 2.0 (H) 03/15/2022   ALKPHOS 55 03/15/2022   AST 45 (H) 03/15/2022   ALT 15 03/15/2022   ANIONGAP 8 03/16/2022   Last lipids No results found for: "CHOL", "HDL", "LDLCALC", "LDLDIRECT", "TRIG", "CHOLHDL"      Objective    BP 272/53 (BP Location: Left Arm, Patient Position: Sitting, Cuff Size: Normal)   Pulse 76   Ht 5' (1.524 m)   Wt 146 lb  12.8 oz (66.6 kg)   SpO2 97%   BMI 28.67 kg/m  BP Readings from Last 3 Encounters:  11/19/23 135/65  06/25/23 119/61  05/06/23 120/63   Wt Readings from Last 3 Encounters:  11/19/23 146 lb 12.8 oz (66.6 kg)  06/25/23 153 lb 14.4 oz (69.8 kg)  04/18/23 152 lb (68.9 kg)        Physical Exam Vitals reviewed.  Constitutional:      General: She is not in acute distress.    Appearance: Normal appearance. She is not ill-appearing, toxic-appearing or diaphoretic.  HENT:     Head: Normocephalic. No raccoon eyes, right periorbital erythema or left periorbital erythema.   Eyes:     Conjunctiva/sclera: Conjunctivae normal.  Cardiovascular:     Rate and Rhythm: Normal rate and regular rhythm.     Pulses: Normal pulses.     Heart sounds: Normal heart sounds. No murmur heard.    No friction rub. No gallop.  Pulmonary:     Effort:  Pulmonary effort is normal. No respiratory distress.     Breath sounds: Normal breath sounds. No stridor. No wheezing, rhonchi or rales.  Abdominal:     General: Bowel sounds are normal. There is no distension.     Palpations: Abdomen is soft.     Tenderness: There is no abdominal tenderness.  Musculoskeletal:     Right lower leg: No edema.     Left lower leg: No edema.  Skin:    Findings: No erythema or rash.  Neurological:     Mental Status: She is alert and oriented to person, place, and time.        No results found for any visits on 11/19/23.  Assessment & Plan     Problem List Items Addressed This Visit       Cardiovascular and Mediastinum   Primary hypertension   Chronic and controlled with current medications. Blood pressure reading today is 135/65 mmHg. - Continue hydrochlorothiazide  25 mg daily - Continue metoprolol 50 mg daily      Aortic atherosclerosis (HCC)     Respiratory   Seasonal allergic rhinitis due to pollen     Digestive   NAFLD (nonalcoholic fatty liver disease)   Chronic condition requiring lifestyle  modifications and monitoring of liver enzymes. Discussed importance of healthy diet and exercise. Mentioned potential future use of medications like Ozempic or Actos based on emerging research. - Encourage healthy diet and exercise - Monitor liver enzymes regularly - Consider referral to gastroenterology if fibrosis or cirrhosis is suspected      Gastroesophageal reflux disease     Musculoskeletal and Integument   Osteopenia   Chronic condition requiring monitoring and supplementation. - Order bone density scan - Recommend multivitamin with vitamin D  and calcium  supplementation      Relevant Orders   DG Bone Density     Other   Vitamin D  deficiency   Sjogren's syndrome (HCC)   Managed by rheumatology with well-managed symptoms. Last visit with rheumatology was on October 14, 2023. - Continue Plaquenil 200 mg daily - Follow up with rheumatology as scheduled      Head injury - Primary   acute Recent head trauma from a falling plaque causing headaches, blurred vision, and pain radiating down the right neck, shoulder, and arm. Differential includes concussion and potential brain bleed. Discussed differences between concussion and brain bleed, and the limitations of CT and MRI in diagnosing concussions. Emphasized importance of imaging to rule out brain bleed due to age and severity of impact. - Order head CT without contrast - Monitor symptoms and avoid activities requiring significant mental effort - Consider MRI if symptoms persist and CT is inconclusive - Use tramadol 50 mg every 6 hours for pain management - Order x-rays of cervical spine and right shoulder      Relevant Orders   CT HEAD WO CONTRAST ( )   Fibromyalgia   Chronic condition with ongoing management. Reports using Tylenol  650 mg CR twice daily, Celebrex 100 mg twice daily, and gabapentin  100 mg twice daily for symptom control. Discussed increasing gabapentin  dosage to improve symptom management. - Continue  Tylenol  650 mg CR twice daily - Continue Celebrex 100 mg twice daily - Continue gabapentin  100 mg twice daily - Increase gabapentin  to 200 mg at night and then 200 mg in the morning if tolerated - Consider further increase to 300 mg if needed      Relevant Medications   gabapentin  (NEURONTIN ) 100 MG capsule   Dyslipidemia  Chronic condition managed with Crestor . No recent lipid panel available. - Continue Crestor  10 mg daily - Order lipid panel at annual wellness visit next month      Chronic insomnia   Chronic condition managed with Klonopin  1 mg at bedtime. Reports fair control but does not wish to increase dosage. Discussed potential switch to Lunesta with cross-tapering if needed. - Continue Klonopin  1 mg at bedtime - Discuss potential switch to Lunesta with cross-tapering if needed      Other Visit Diagnoses       New onset of headaches       Relevant Medications   gabapentin  (NEURONTIN ) 100 MG capsule   Other Relevant Orders   CT HEAD WO CONTRAST ( )     Encounter for screening mammogram for malignant neoplasm of breast       Relevant Orders   MM 3D SCREENING MAMMOGRAM BILATERAL BREAST       General Health Maintenance Routine screenings and health maintenance. - Order mammogram - Schedule annual wellness visit after November 25, 2023    Return in about 6 weeks (around 12/29/2023) for AWV.      Mimi Alt, MD  [facility] [phone] (phone) [phone] (fax)  [facility]

## 2023-11-19 NOTE — Assessment & Plan Note (Signed)
 Chronic condition managed with Crestor . No recent lipid panel available. - Continue Crestor  10 mg daily - Order lipid panel at annual wellness visit next month

## 2023-11-19 NOTE — Assessment & Plan Note (Signed)
 Chronic condition with ongoing management. Reports using Tylenol  650 mg CR twice daily, Celebrex 100 mg twice daily, and gabapentin  100 mg twice daily for symptom control. Discussed increasing gabapentin  dosage to improve symptom management. - Continue Tylenol  650 mg CR twice daily - Continue Celebrex 100 mg twice daily - Continue gabapentin  100 mg twice daily - Increase gabapentin  to 200 mg at night and then 200 mg in the morning if tolerated - Consider further increase to 300 mg if needed

## 2023-11-19 NOTE — Assessment & Plan Note (Signed)
 Chronic condition managed with Klonopin  1 mg at bedtime. Reports fair control but does not wish to increase dosage. Discussed potential switch to Lunesta with cross-tapering if needed. - Continue Klonopin  1 mg at bedtime - Discuss potential switch to Lunesta with cross-tapering if needed

## 2023-11-19 NOTE — Assessment & Plan Note (Signed)
 acute Recent head trauma from a falling plaque causing headaches, blurred vision, and pain radiating down the right neck, shoulder, and arm. Differential includes concussion and potential brain bleed. Discussed differences between concussion and brain bleed, and the limitations of CT and MRI in diagnosing concussions. Emphasized importance of imaging to rule out brain bleed due to age and severity of impact. - Order head CT without contrast - Monitor symptoms and avoid activities requiring significant mental effort - Consider MRI if symptoms persist and CT is inconclusive - Use tramadol 50 mg every 6 hours for pain management - Order x-rays of cervical spine and right shoulder

## 2023-11-19 NOTE — Patient Instructions (Addendum)
 Please contact [phone] to schedule your mammogram. You will be asked your location preference to have procedure performed. You have two options listed below.  1) [facility] located at [address] [place], [place] [address] 2) [facility] located at [address] [place], [place] [address]  Upon results being received our office will contact you. As well as all results can be viewed through your MyChart. Please feel free to contact us  if you have any further questions or concerns.  VISIT SUMMARY:  Today, we discussed your recent head trauma and ongoing management of your chronic conditions. We reviewed your medications and made some adjustments to better manage your symptoms. We also planned for some imaging studies and follow-up appointments to ensure your health is closely monitored.  YOUR PLAN:  -HEAD TRAUMA WITH POSSIBLE CONCUSSION: You experienced a head injury that may have caused a concussion or a brain bleed. We will do a head CT scan to check for any serious issues. In the meantime, avoid activities that require a lot of mental effort and take tramadol 50 mg every 6 hours for pain. We also ordered x-rays for your neck and shoulder to check for any injuries there.  INSTRUCTIONS:  - Follow up with orthopedics for your knee in mid-February. - Follow up with primary care for your annual wellness visit next month. - Follow up with the imaging center for your head CT.

## 2023-11-19 NOTE — Assessment & Plan Note (Signed)
 Chronic condition requiring lifestyle modifications and monitoring of liver enzymes. Discussed importance of healthy diet and exercise. Mentioned potential future use of medications like Ozempic or Actos based on emerging research. - Encourage healthy diet and exercise - Monitor liver enzymes regularly - Consider referral to gastroenterology if fibrosis or cirrhosis is suspected

## 2023-11-19 NOTE — Assessment & Plan Note (Signed)
 Managed by rheumatology with well-managed symptoms. Last visit with rheumatology was on October 14, 2023. - Continue Plaquenil 200 mg daily - Follow up with rheumatology as scheduled

## 2023-11-19 NOTE — Assessment & Plan Note (Signed)
 Chronic and controlled with current medications. Blood pressure reading today is 135/65 mmHg. - Continue hydrochlorothiazide  25 mg daily - Continue metoprolol 50 mg daily

## 2023-11-19 NOTE — Assessment & Plan Note (Signed)
 Chronic condition requiring monitoring and supplementation. - Order bone density scan - Recommend multivitamin with vitamin D  and calcium  supplementation

## 2023-11-20 ENCOUNTER — Encounter: Payer: Self-pay | Admitting: Family Medicine

## 2023-11-20 NOTE — Addendum Note (Signed)
Addended by: Bing Neighbors on: 11/20/2023 11:10 PM   Modules accepted: Orders

## 2023-11-21 ENCOUNTER — Ambulatory Visit
Admission: RE | Admit: 2023-11-21 | Discharge: 2023-11-21 | Disposition: A | Discharge status: Home / Self Care | Payer: Medicare Other | Source: Ambulatory Visit | Attending: Family Medicine | Admitting: Family Medicine

## 2023-11-21 DIAGNOSIS — M25511 Pain in right shoulder: Secondary | ICD-10-CM | POA: Insufficient documentation

## 2023-11-21 DIAGNOSIS — S0990XA Unspecified injury of head, initial encounter: Secondary | ICD-10-CM | POA: Diagnosis present

## 2023-11-21 DIAGNOSIS — M542 Cervicalgia: Secondary | ICD-10-CM

## 2023-11-21 DIAGNOSIS — R519 Headache, unspecified: Secondary | ICD-10-CM | POA: Insufficient documentation

## 2023-11-26 ENCOUNTER — Encounter: Payer: Self-pay | Admitting: Family Medicine

## 2023-11-26 ENCOUNTER — Telehealth: Payer: Self-pay

## 2023-11-26 ENCOUNTER — Ambulatory Visit: Payer: Self-pay | Admitting: *Deleted

## 2023-11-26 ENCOUNTER — Ambulatory Visit: Payer: Medicare Other | Admitting: Family Medicine

## 2023-11-26 VITALS — BP 146/68 | HR 87 | Resp 18 | Ht 60.0 in | Wt 145.0 lb

## 2023-11-26 DIAGNOSIS — M25432 Effusion, left wrist: Secondary | ICD-10-CM

## 2023-11-26 DIAGNOSIS — M4322 Fusion of spine, cervical region: Secondary | ICD-10-CM

## 2023-11-26 DIAGNOSIS — M189 Osteoarthritis of first carpometacarpal joint, unspecified: Secondary | ICD-10-CM

## 2023-11-26 DIAGNOSIS — M79644 Pain in right finger(s): Secondary | ICD-10-CM | POA: Insufficient documentation

## 2023-11-26 DIAGNOSIS — I700 Atherosclerosis of aorta: Secondary | ICD-10-CM | POA: Diagnosis not present

## 2023-11-26 DIAGNOSIS — M25532 Pain in left wrist: Secondary | ICD-10-CM

## 2023-11-26 DIAGNOSIS — G959 Disease of spinal cord, unspecified: Secondary | ICD-10-CM

## 2023-11-26 DIAGNOSIS — M3500 Sicca syndrome, unspecified: Secondary | ICD-10-CM | POA: Diagnosis not present

## 2023-11-26 HISTORY — DX: Effusion, left wrist: M25.432

## 2023-11-26 HISTORY — DX: Pain in right finger(s): M79.644

## 2023-11-26 HISTORY — DX: Osteoarthritis of first carpometacarpal joint, unspecified: M18.9

## 2023-11-26 MED ORDER — OXYCODONE-ACETAMINOPHEN 5-325 MG PO TABS: 0.5000 | ORAL_TABLET | ORAL | 0 refills | Status: AC | PRN

## 2023-11-26 NOTE — Patient Instructions (Signed)
I am concerned that your wrist has an infection in the joint and recommend you be seen today at your orthopedist's walk-in clinic.  If they are unable to see you, I recommend you proceed to the ER for urgent evaluation and treatment.

## 2023-11-26 NOTE — Telephone Encounter (Signed)
Reason for Disposition  [1] Red area or streak AND [2] large (> 2 in. or 5 cm)  Answer Assessment - Initial Assessment Questions 1. ONSET: "When did the pain start?"     Started Monday evening- pain in arm - then hand 2. LOCATION: "Where is the pain located?"     left 3. PAIN: "How bad is the pain?" (Scale 1-10; or mild, moderate, severe)   - MILD (1-3): Doesn't interfere with normal activities.   - MODERATE (4-7): Interferes with normal activities (e.g., work or school) or awakens from sleep.   - SEVERE (8-10): Excruciating pain, unable to do any normal activities, unable to hold a cup of water.     Pain in left hand now- unable to use hand 4. WORK OR EXERCISE: "Has there been any recent work or exercise that involved this part of the body?"     Recent head injury 5. CAUSE: "What do you think is causing the arm pain?"     unsure 6. OTHER SYMPTOMS: "Do you have any other symptoms?" (e.g., neck pain, swelling, rash, fever, numbness, weakness)     Swelling, hand pain  Protocols used: Arm Pain-A-AH

## 2023-11-26 NOTE — Telephone Encounter (Signed)
Patient is calling for imaging results- has not heard anything Chief Complaint: new symptoms- left arm pain down to hand. Hand is red and swollen- unable to close Symptoms: left arm/hand pain and swelling Frequency: started Monday  Disposition: [] ED /[] Urgent Care (no appt availability in office) / [x] Appointment(In office/virtual)/ []  [facility]/ [] Home Care/ [] Refused Recommended Disposition /[] [facility]/ []  Follow-up with PCP Additional Notes: Call to office- per Veronica's help was able to schedule patient in office for evaluation of new symptoms.

## 2023-11-26 NOTE — Progress Notes (Signed)
Established patient visit   Patient: Grace Wheeler   DOB: 1946/04/20   78 y.o. Female  MRN: [id number] Visit Date: 11/26/2023  Today's healthcare provider: Sherlyn Hay, DO   Chief Complaint  Patient presents with   Hand Pain   Arm Pain   Subjective    HPI The patient is a 78 year old individual with a complex medical history including insomnia, gastric reflux, hypertension, Sjogren's syndrome, hyperlipidemia, aortic atherosclerosis, non-alcoholic fatty liver disease, fibromyalgia, osteopenia, vitamin D deficiency, Raynaud's disease, stress incontinence, and neuropathy. The patient presented for a routine follow-up, primarily for fibromyalgia management.   The patient reported a recent incident of head trauma, which occurred when a plaque fell from a door and struck the top of her head. Since the incident, the patient has experienced persistent headaches, blurred vision, and pain radiating down the right side of the neck, shoulder, and arm. She describes her right shoulder as feeling like something hit her with hammer. The patient has been managing the pain with Tylenol and a topical aspirin with lidocaine spray, but reports the pain as a constant dull ache that has been disruptive to sleep.   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 The other knee is also problematic, but the patient is hesitant to pursue another replacement surgery due to the previous negative experience.   - right knee replacement on 07/31/23  Her final concern to is that she woke up one morning with her left wrist hurting severely and her fist clenched shut. She has tried some tramadol she already had for pain, but it hasn't helped.     Medications: Outpatient Medications Prior to Visit  Medication Sig   acetaminophen (TYLENOL) 650 MG CR tablet Tylenol Arthritis Pain 650 mg tablet,extended release  Take 1  tablet twice a day by oral route.   clonazePAM (KLONOPIN) 1 MG tablet TAKE 1 TABLET BY MOUTH AT  BEDTIME   diclofenac Sodium (VOLTAREN) 1 % GEL diclofenac 1 % topical gel  APPLY 2 GRAM TO THE AFFECTED AREA(S) BY TOPICAL ROUTE 4 TIMES PER DAY   fluticasone (FLONASE) 50 MCG/ACT nasal spray Place 2 sprays into both nostrils daily.   gabapentin (NEURONTIN) 100 MG capsule Take 3 capsules (300 mg total) by mouth 2 (two) times daily.   hydroxychloroquine (PLAQUENIL) 200 MG tablet hydroxychloroquine 200 mg tablet  TAKE 1 TABLET BY MOUTH  DAILY   Light Mineral Oil-Mineral Oil 0.5-0.5 % EMUL Retaine MGD (PF) 0.5 %-0.5 % eye drops in a dropperette  As Needed   metoprolol succinate (TOPROL-XL) 50 MG 24 hr tablet metoprolol succinate ER 50 mg tablet,extended release 24 hr  TAKE 1 TABLET BY MOUTH AT  BEDTIME   rosuvastatin (CRESTOR) 10 MG tablet TAKE 1 TABLET BY MOUTH DAILY   [DISCONTINUED] hydrochlorothiazide (HYDRODIURIL) 25 MG tablet TAKE 1 TABLET BY MOUTH DAILY   No facility-administered medications prior to visit.    Review of Systems  Constitutional:  Negative for appetite change, chills, fatigue and fever.  Respiratory:  Negative for chest tightness and shortness of breath.   Cardiovascular:  Negative for chest pain and palpitations.  Gastrointestinal:  Negative for abdominal pain, nausea and vomiting.  Musculoskeletal:  Positive for arthralgias, joint swelling and neck pain.  Neurological:  Negative for dizziness and weakness.        Objective    BP (!) 146/68   Pulse 87  Resp 18   Ht 5' (1.524 m)   Wt 145 lb (65.8 kg)   SpO2 100%   BMI 28.32 kg/m     Physical Exam Vitals and nursing note reviewed.  Constitutional:      General: She is not in acute distress.    Appearance: Normal appearance.  HENT:     Head: Normocephalic and atraumatic.  Eyes:     General: No scleral icterus.    Conjunctiva/sclera: Conjunctivae normal.  Cardiovascular:     Rate and Rhythm: Normal  rate.  Pulmonary:     Effort: Pulmonary effort is normal.  Musculoskeletal:        General: Swelling (lateral left wrist (photos place in media section of chart)) and tenderness (lateral left wrist) present.     Right shoulder: Tenderness present. No swelling, deformity, effusion, laceration, bony tenderness or crepitus. Decreased range of motion (s/t pain). Normal strength. Normal pulse.     Cervical back: Tenderness present.  Skin:    Findings: Erythema (lateral left wrist (see photo)) present.  Neurological:     Mental Status: She is alert and oriented to person, place, and time. Mental status is at baseline.  Psychiatric:        Mood and Affect: Mood normal.        Behavior: Behavior normal.      No results found for any visits on 11/26/23.  Assessment & Plan    Cervical vertebral fusion Assessment & Plan: Fusion of C2 and C3 vertebrae with underdeveloped disc noted on recent cervical x-ray. Not related to recent trauma but may contribute to symptoms. Referred to neurosurgery for further evaluation at previous visit. - Follow up with neurosurgery for further evaluation.  Orders: -     MR CERVICAL SPINE WO CONTRAST; Future -     oxyCODONE-Acetaminophen; Take 0.5-1 tablets by mouth every 4 (four) hours as needed for up to 5 days for severe pain (pain score 7-10).  Dispense: 20 tablet; Refill: 0  Cervical myelopathy (HCC) Assessment & Plan: Persistent pain radiating from the neck to the shoulder following trauma from a plaque falling on the head. Associated with difficulty swallowing and localized tenderness. MRI needed to assess injury extent and rule out serious conditions. Patient prefers MRI at the outpatient imaging center across from the hospital. - Order MRI of the cervical spine at the outpatient imaging center across from the hospital.  Orders: -     MR CERVICAL SPINE WO CONTRAST; Future -     oxyCODONE-Acetaminophen; Take 0.5-1 tablets by mouth every 4 (four) hours as  needed for up to 5 days for severe pain (pain score 7-10).  Dispense: 20 tablet; Refill: 0  Sjogren's syndrome, with unspecified organ involvement [facility] Assessment & Plan: Noted. Managed with hydroxychloroquine (Plaquenil), which contributes to suppression of immune system.  Continue to monitor.   Aortic atherosclerosis (HCC) Assessment & Plan: Noted.  No acute concerns.  Continue to monitor.   Pain and swelling of left wrist Assessment & Plan: Acute severe pain, swelling, and redness in the left wrist with limited range of motion and significant tenderness. Differential diagnosis includes septic arthritis, gout, or trauma-related injury. Septic arthritis is a primary concern given immune suppression with hydroxychloroquine (Plaquenil). Urgent joint aspiration and possible IV antibiotics discussed. - Refer to urgent ortho for possible joint aspiration and evaluation. - Consider ER visit if ortho is unavailable. - Order CBC. - Prescribe oxycodone for pain management.    Follow-up - Follow up with neurosurgery for cervical  spine evaluation. - Follow up with ortho or ER for left wrist evaluation and possible joint aspiration.   Return if symptoms worsen or fail to improve.      I discussed the assessment and treatment plan with the patient  The patient was provided an opportunity to ask questions and all were answered. The patient agreed with the plan and demonstrated an understanding of the instructions.   The patient was advised to call back or seek an in-person evaluation if the symptoms worsen or if the condition fails to improve as anticipated.    Sherlyn Hay, DO  [facility] [phone] (phone) [phone] (fax)  [facility]

## 2023-11-26 NOTE — Telephone Encounter (Signed)
Patient was seen in office today and all results (CT and Xray reviewed during visit)

## 2023-11-26 NOTE — Telephone Encounter (Signed)
Cervical spine and shoulder xrays are pending final radiology review   Head CT was normal.   It did not show any signs of stroke,masses, bleeding nor abnormal fluid collection.

## 2023-11-26 NOTE — Telephone Encounter (Signed)
Copied from CRM [id number]. Topic: General - Other >> Nov 25, 2023  2:24 PM Haroldine Laws wrote: Reason for CRM: pt is waiting for her results from the CT and XRAYS she had Friday.  [phone]

## 2023-11-26 NOTE — Telephone Encounter (Signed)
The patient would like to speak with a member of practice staff when possible regarding their results   Please contact when available

## 2023-11-26 NOTE — Addendum Note (Signed)
Addended by: Bing Neighbors on: 11/26/2023 12:58 PM   Modules accepted: Orders

## 2023-11-28 ENCOUNTER — Ambulatory Visit
Admission: RE | Admit: 2023-11-28 | Discharge: 2023-11-28 | Disposition: A | Discharge status: Home / Self Care | Payer: Medicare Other | Source: Ambulatory Visit | Attending: Family Medicine | Admitting: Family Medicine

## 2023-11-28 DIAGNOSIS — M4322 Fusion of spine, cervical region: Secondary | ICD-10-CM

## 2023-11-28 DIAGNOSIS — G959 Disease of spinal cord, unspecified: Secondary | ICD-10-CM

## 2023-12-02 ENCOUNTER — Other Ambulatory Visit: Payer: Self-pay | Admitting: Family Medicine

## 2023-12-02 ENCOUNTER — Encounter: Payer: Self-pay | Admitting: Family Medicine

## 2023-12-07 DIAGNOSIS — M4322 Fusion of spine, cervical region: Secondary | ICD-10-CM | POA: Insufficient documentation

## 2023-12-07 DIAGNOSIS — G959 Disease of spinal cord, unspecified: Secondary | ICD-10-CM | POA: Insufficient documentation

## 2023-12-07 DIAGNOSIS — M25532 Pain in left wrist: Secondary | ICD-10-CM | POA: Insufficient documentation

## 2023-12-07 NOTE — Assessment & Plan Note (Addendum)
Acute severe pain, swelling, and redness in the left wrist with limited range of motion and significant tenderness. Differential diagnosis includes septic arthritis, gout, or trauma-related injury. Septic arthritis is a primary concern given immune suppression with hydroxychloroquine (Plaquenil). Urgent joint aspiration and possible IV antibiotics discussed. - Refer to urgent ortho for possible joint aspiration and evaluation. - Consider ER visit if ortho is unavailable. - Order CBC. - Prescribe oxycodone for pain management.

## 2023-12-07 NOTE — Assessment & Plan Note (Signed)
 Noted.  No acute concerns.  Continue to monitor.

## 2023-12-07 NOTE — Assessment & Plan Note (Addendum)
Noted. Managed with hydroxychloroquine (Plaquenil), which contributes to suppression of immune system.  Continue to monitor.

## 2023-12-07 NOTE — Assessment & Plan Note (Signed)
Fusion of C2 and C3 vertebrae with underdeveloped disc noted on recent cervical x-ray. Not related to recent trauma but may contribute to symptoms. Referred to neurosurgery for further evaluation at previous visit. - Follow up with neurosurgery for further evaluation.

## 2023-12-07 NOTE — Assessment & Plan Note (Signed)
Persistent pain radiating from the neck to the shoulder following trauma from a plaque falling on the head. Associated with difficulty swallowing and localized tenderness. MRI needed to assess injury extent and rule out serious conditions. Patient prefers MRI at the outpatient imaging center across from the hospital. - Order MRI of the cervical spine at the outpatient imaging center across from the hospital.

## 2023-12-18 ENCOUNTER — Other Ambulatory Visit: Payer: Self-pay | Admitting: Family Medicine

## 2023-12-18 NOTE — Progress Notes (Deleted)
 Referring Physician:  [name], MD [address] [place],  [place] [address]  Primary Physician:  [name], MD  History of Present Illness: 12/18/2023*** [name] has a history of HTN, GERD, NAFLD, osteopenia, sjogren's syndrome, FM.   A plaque fell off the wall and landed on her head back in January. Since that time, she has constant neck and right shoulder/arm pain.     PCP gave her percocet for pain.    Neck pain, arm pain?  Duration: *** Location: *** Quality: *** Severity: ***  Precipitating: aggravated by *** Modifying factors: made better by *** Weakness: none Timing: *** Bowel/Bladder Dysfunction: none  Conservative measures:  Physical therapy: *** has not participated in? Multimodal medical therapy including regular antiinflammatories: percocet, tylenol, diclofenac, gabapentin  Injections: *** no epidural steroid injections?  Past Surgery: ***no spinal surgeries   Sharaya Hogsett has ***no symptoms of cervical myelopathy.  The symptoms are causing a significant impact on the patient's life.   Review of Systems:  A 10 point review of systems is negative, except for the pertinent positives and negatives detailed in the HPI.  Past Medical History: Past Medical History:  Diagnosis Date   Arthritis    Autoimmune disease (HCC)    multiple autoimmune system problems   Basal cell carcinoma 10/23/2022   Left upper arm. Nodular, infiltrative type. EDC   Blood transfusion without reported diagnosis    Fibromyalgia    GERD (gastroesophageal reflux disease)    Hypertension    Lumbar spondylitis (HCC)    Raynaud's disease    Sjogren's disease (HCC)    TMJ (dislocation of temporomandibular joint)     Past Surgical History: Past Surgical History:  Procedure Laterality Date   ABDOMINAL HYSTERECTOMY     BLADDER SURGERY     BURCH PROCEDURE     CARPAL TUNNEL RELEASE Bilateral     Allergies: Allergies as  of 12/22/2023 - Review Complete 11/26/2023  Allergen Reaction Noted   Ambien [zolpidem]  03/27/2022   Levofloxacin  03/27/2022   Trazodone and nefazodone  07/02/2022    Medications: Outpatient Encounter Medications as of 12/22/2023  Medication Sig   acetaminophen (TYLENOL) 650 MG CR tablet Tylenol Arthritis Pain 650 mg tablet,extended release  Take 1 tablet twice a day by oral route.   clonazePAM (KLONOPIN) 1 MG tablet TAKE 1 TABLET BY MOUTH AT  BEDTIME   diclofenac Sodium (VOLTAREN) 1 % GEL diclofenac 1 % topical gel  APPLY 2 GRAM TO THE AFFECTED AREA(S) BY TOPICAL ROUTE 4 TIMES PER DAY   fluticasone (FLONASE) 50 MCG/ACT nasal spray Place 2 sprays into both nostrils daily.   gabapentin (NEURONTIN) 100 MG capsule Take 3 capsules (300 mg total) by mouth 2 (two) times daily.   hydrochlorothiazide (HYDRODIURIL) 25 MG tablet TAKE 1 TABLET BY MOUTH DAILY   hydroxychloroquine (PLAQUENIL) 200 MG tablet hydroxychloroquine 200 mg tablet  TAKE 1 TABLET BY MOUTH  DAILY   Light Mineral Oil-Mineral Oil 0.5-0.5 % EMUL Retaine MGD (PF) 0.5 %-0.5 % eye drops in a dropperette  As Needed   metoprolol succinate (TOPROL-XL) 50 MG 24 hr tablet metoprolol succinate ER 50 mg tablet,extended release 24 hr  TAKE 1 TABLET BY MOUTH AT  BEDTIME   rosuvastatin (CRESTOR) 10 MG tablet TAKE 1 TABLET BY MOUTH DAILY   No facility-administered encounter medications on file as of 12/22/2023.    Social History: Social History   Tobacco Use   Smoking status: Never   Smokeless tobacco: Never  Family Medical History: Family History  Problem Relation Age of Onset   Breast cancer Mother    Cancer Mother    Heart failure Father    Cancer Maternal Grandmother    Cancer Maternal Grandfather    Heart failure Brother    Heart failure Brother    Heart failure Brother    Heart failure Brother     Physical Examination: There were no vitals filed for this visit.  General: Patient is well developed, well  nourished, calm, collected, and in no apparent distress. Attention to examination is appropriate.  Respiratory: Patient is breathing without any difficulty.   NEUROLOGICAL:     Awake, alert, oriented to person, place, and time.  Speech is clear and fluent. Fund of knowledge is appropriate.   Cranial Nerves: Pupils equal round and reactive to light.  Facial tone is symmetric.    *** ROM of cervical spine *** pain *** posterior cervical tenderness. *** tenderness in bilateral trapezial region.   *** ROM of lumbar spine *** pain *** posterior lumbar tenderness.   No abnormal lesions on exposed skin.   Strength: Side Biceps Triceps Deltoid Interossei Grip Wrist Ext. Wrist Flex.  R 5 5 5 5 5 5 5   L 5 5 5 5 5 5 5    Side Iliopsoas Quads Hamstring PF DF EHL  R 5 5 5 5 5 5   L 5 5 5 5 5 5    Reflexes are ***2+ and symmetric at the biceps, brachioradialis, patella and achilles.   Hoffman's is absent.  Clonus is not present.   Bilateral upper and lower extremity sensation is intact to light touch.     Gait is normal.   ***No difficulty with tandem gait.    Medical Decision Making  Imaging: Cervical xrays dated 11/21/23:  FINDINGS: There is osseous fusion of the C2 and C3 posterior elements. There is a hypoplastic disc at C2-3 and osseous fusion of the anterior and posterior C2-3 disc level.   No sagittal spondylolisthesis.   The atlantodens interval is intact.   Facet joint sclerosis and hypertrophy greatest at C4-5 through C6-7. Likely bilateral C3-4 neuroforaminal narrowing on oblique views.   No prevertebral soft tissue swelling.  The lung apices are clear.   IMPRESSION: 1. Osseous fusion of the C2 and C3 posterior elements and anterior and posterior C2-3 disc space fusion with hypoplastic disc. 2. Facet joint arthropathy greatest at C4-5 through C6-7.     Electronically Signed   By: Neita Garnet M.D.   On: 11/26/2023 12:40   Cervical MRI dated 11/28/23:   FINDINGS: Alignment: Cervical spine straightening. Trace anterolisthesis of C5 on C6.   Vertebrae: Congenital C2-3 fusion. No fracture, suspicious marrow lesion, or significant marrow edema.   Cord: Normal signal.   Posterior Fossa, vertebral arteries, paraspinal tissues: Unremarkable.   Disc levels:   C2-3: Congenital fusion with only a rudimentary disc.  No stenosis.   C3-4: Disc bulging, uncovertebral spurring, and mild facet arthrosis result in mild spinal stenosis without neural foraminal stenosis.   C4-5: Mild disc bulging and moderate left facet arthrosis result in mild left neural foraminal stenosis without spinal stenosis.   C5-6: Mild disc bulging and moderate left facet arthrosis result in mild left neural foraminal stenosis and borderline to mild spinal stenosis.   C6-7: Tiny central disc protrusion without stenosis.   C7-T1: Negative.   T1-2: Negative.   IMPRESSION: 1. Mild cervical spondylosis and moderate facet arthrosis with mild spinal stenosis at C3-4. 2.  Mild left neural foraminal stenosis at C4-5 and C5-6.     Electronically Signed   By: Sebastian Ache M.D.   On: 11/28/2023 13:16  I have personally reviewed the images and agree with the above interpretation.  Assessment and Plan: Ms. Burggraf is a pleasant 78 y.o. female has ***  Treatment options discussed with patient and following plan made:   - Order for physical therapy for *** spine ***. Patient to call to schedule appointment. *** - Continue current medications including ***. Reviewed dosing and side effects.  - Prescription for ***. Reviewed dosing and side effects. Take with food.  - Prescription for *** to take prn muscle spasms. Reviewed dosing and side effects. Discussed this can cause drowsiness.  - MRI of *** to further evaluate *** radiculopathy. No improvement time or medications (***).  - Referral to PMR at [facility] to discuss possible *** injections.  - Will schedule phone visit to  review MRI results once I get them back.   I spent a total of *** minutes in face-to-face and non-face-to-face activities related to this patient's care today including review of outside records, review of imaging, review of symptoms, physical exam, discussion of differential diagnosis, discussion of treatment options, and documentation.   Thank you for involving me in the care of this patient.   Drake Leach PA-C Dept. of Neurosurgery

## 2023-12-19 NOTE — Telephone Encounter (Signed)
Requested medication (s) are due for refill today: yes  Requested medication (s) are on the active medication list: yes  Last refill:  01/23/23 #90 3 RF  Future visit scheduled: yes  Notes to clinic:  overdue lab work     Requested Prescriptions  Pending Prescriptions Disp Refills   rosuvastatin (CRESTOR) 10 MG tablet [Pharmacy Med Name: Rosuvastatin Calcium 10 MG Oral Tablet] 90 tablet 3    Sig: TAKE 1 TABLET BY MOUTH DAILY     Cardiovascular:  Antilipid - Statins 2 Failed - 12/19/2023 12:10 PM      Failed - Cr in normal range and within 360 days    Creatinine, Ser  Date Value Ref Range Status  12/04/2022 0.89 0.57 - 1.00 mg/dL Final         Failed - Lipid Panel in normal range within the last 12 months    No results found for: "CHOL", "POCCHOL", "CHOLTOT" No results found for: "LDLCALC", "LDLC", "HIRISKLDL", "POCLDL", "LDLDIRECT", "REALLDLC", "TOTLDLC" No results found for: "HDL", "POCHDL" No results found for: "TRIG", "POCTRIG"       Passed - Patient is not pregnant      Passed - Valid encounter within last 12 months    Recent Outpatient Visits           3 weeks ago Cervical vertebral fusion   [facility] [place], Monico Blitz, DO   1 month ago Injury of head, initial encounter   [facility] Simmons-Robinson, [place], MD   5 months ago Primary hypertension   [facility] [place], [place], MD   8 months ago Cough, unspecified type   [facility] [place], [place], PA-C   11 months ago Encounter for annual wellness exam in Medicare patient   [facility] Simmons-Robinson, [place], MD       Future Appointments             In 1 month Simmons-Robinson, Tawanna Cooler, MD [facility]   In 5 months Willeen Niece, MD [facility]

## 2023-12-22 ENCOUNTER — Ambulatory Visit: Payer: Medicare Other | Admitting: Orthopedic Surgery

## 2023-12-24 ENCOUNTER — Encounter: Payer: Self-pay | Admitting: Family Medicine

## 2023-12-24 DIAGNOSIS — K760 Fatty (change of) liver, not elsewhere classified: Secondary | ICD-10-CM

## 2023-12-24 DIAGNOSIS — E785 Hyperlipidemia, unspecified: Secondary | ICD-10-CM

## 2023-12-24 DIAGNOSIS — E559 Vitamin D deficiency, unspecified: Secondary | ICD-10-CM

## 2023-12-24 DIAGNOSIS — F5104 Psychophysiologic insomnia: Secondary | ICD-10-CM

## 2023-12-24 DIAGNOSIS — I10 Essential (primary) hypertension: Secondary | ICD-10-CM

## 2023-12-24 DIAGNOSIS — Z131 Encounter for screening for diabetes mellitus: Secondary | ICD-10-CM

## 2023-12-24 DIAGNOSIS — J301 Allergic rhinitis due to pollen: Secondary | ICD-10-CM

## 2023-12-25 MED ORDER — ROSUVASTATIN CALCIUM 10 MG PO TABS: 10.0000 mg | ORAL_TABLET | Freq: Every day | ORAL | 3 refills | Status: AC

## 2023-12-25 NOTE — Addendum Note (Signed)
Addended by: Bing Neighbors on: 12/25/2023 03:29 PM   Modules accepted: Orders

## 2023-12-29 NOTE — Progress Notes (Signed)
 Referring Physician:  [name], MD [address] [place],  [place] [address]  Primary Physician:  [name], MD  History of Present Illness: 01/05/2024 [name] has a history of HTN, GERD, NAFLD, osteopenia, sjogren's syndrome, FM.   A plaque fell off the wall and landed on her head back in January. Since that time, she had constant neck and right shoulder/arm pain. Headaches have mostly resolved- only are occasional.   She still has constant neck and bilateral shoulder pain that is improving. Sharp pain in her shoulders has resolved. No arm pain. Pain is worse with sitting in prolonged position. She has improvement with laying down. No numbness, tingling, or weakness.   She has some dexterity issues with left hand due to arthritis- has been going on for years. Balance is still off from right TKA 5 months ago.   She is taking neurotin- she is trying to wean down on her dose. She is currently on 200mg  bid. She is taking mobic. She is using voltaren gel.   She does not smoke.   Bowel/Bladder Dysfunction: none  Conservative measures:  Physical therapy:  has not participated in Multimodal medical therapy including regular antiinflammatories: percocet, tylenol, diclofenac, gabapentin, mobic Injections:  no epidural steroid injections  Past Surgery: no spinal surgeries   Grace Wheeler has no symptoms of cervical myelopathy.  The symptoms are causing a significant impact on the patient's life.   Review of Systems:  A 10 point review of systems is negative, except for the pertinent positives and negatives detailed in the HPI.  Past Medical History: Past Medical History:  Diagnosis Date   Arthritis    Autoimmune disease (HCC)    multiple autoimmune system problems   Basal cell carcinoma 10/23/2022   Left upper arm. Nodular, infiltrative type. EDC   Blood transfusion without reported diagnosis    Fibromyalgia    GERD  (gastroesophageal reflux disease)    Hypertension    Lumbar spondylitis (HCC)    Raynaud's disease    Sjogren's disease (HCC)    TMJ (dislocation of temporomandibular joint)     Past Surgical History: Past Surgical History:  Procedure Laterality Date   ABDOMINAL HYSTERECTOMY     BLADDER SURGERY     BURCH PROCEDURE     CARPAL TUNNEL RELEASE Bilateral     Allergies: Allergies as of 01/05/2024 - Review Complete 01/05/2024  Allergen Reaction Noted   Ambien [zolpidem]  03/27/2022   Baclofen Other (See Comments) 08/06/2022   Levofloxacin  03/27/2022   Orphenadrine Other (See Comments) 08/06/2022   Pregabalin Other (See Comments) 08/06/2022   Tizanidine Other (See Comments) 08/06/2022   Trazodone and nefazodone  07/02/2022   Celecoxib Diarrhea and Other (See Comments) 07/31/2023    Medications: Outpatient Encounter Medications as of 01/05/2024  Medication Sig   acetaminophen (TYLENOL) 650 MG CR tablet Tylenol Arthritis Pain 650 mg tablet,extended release  Take 1 tablet twice a day by oral route.   clonazePAM (KLONOPIN) 1 MG tablet TAKE 1 TABLET BY MOUTH AT  BEDTIME   colchicine 0.6 MG tablet    cycloSPORINE (RESTASIS) 0.05 % ophthalmic emulsion    diclofenac Sodium (VOLTAREN) 1 % GEL diclofenac 1 % topical gel  APPLY 2 GRAM TO THE AFFECTED AREA(S) BY TOPICAL ROUTE 4 TIMES PER DAY   gabapentin (NEURONTIN) 100 MG capsule Take 3 capsules (300 mg total) by mouth 2 (two) times daily.   hydrochlorothiazide (HYDRODIURIL) 25 MG tablet TAKE 1 TABLET BY MOUTH DAILY  hydroxychloroquine (PLAQUENIL) 200 MG tablet hydroxychloroquine 200 mg tablet  TAKE 1 TABLET BY MOUTH  DAILY   Light Mineral Oil-Mineral Oil 0.5-0.5 % EMUL Retaine MGD (PF) 0.5 %-0.5 % eye drops in a dropperette  As Needed   Meloxicam 7.5 MG TBDP    metoprolol succinate (TOPROL-XL) 50 MG 24 hr tablet metoprolol succinate ER 50 mg tablet,extended release 24 hr  TAKE 1 TABLET BY MOUTH AT  BEDTIME   rosuvastatin (CRESTOR) 10  MG tablet Take 1 tablet (10 mg total) by mouth daily.   [DISCONTINUED] fluticasone (FLONASE) 50 MCG/ACT nasal spray Place 2 sprays into both nostrils daily.   No facility-administered encounter medications on file as of 01/05/2024.    Social History: Social History   Tobacco Use   Smoking status: Never   Smokeless tobacco: Never    Family Medical History: Family History  Problem Relation Age of Onset   Breast cancer Mother    Cancer Mother    Heart failure Father    Cancer Maternal Grandmother    Cancer Maternal Grandfather    Heart failure Brother    Heart failure Brother    Heart failure Brother    Heart failure Brother     Physical Examination: Vitals:   01/05/24 1305  BP: 130/82    General: Patient is well developed, well nourished, calm, collected, and in no apparent distress. Attention to examination is appropriate.  Respiratory: Patient is breathing without any difficulty.   NEUROLOGICAL:     Awake, alert, oriented to person, place, and time.  Speech is clear and fluent. Fund of knowledge is appropriate.   Cranial Nerves: Pupils equal round and reactive to light.  Facial tone is symmetric.    No posterior cervical tenderness. Mild tenderness in bilateral trapezial region.   Good ROM of both shoulders without pain.   No abnormal lesions on exposed skin.   Strength: Side Biceps Triceps Deltoid Interossei Grip Wrist Ext. Wrist Flex.  R 5 5 5 5 5 5 5   L 5 5 5 5 5 5 5    Side Iliopsoas Quads Hamstring PF DF EHL  R 5 5 5 5 5 5   L 5 5 5 5 5 5    Reflexes are 2+ and symmetric at the biceps, brachioradialis, patella and achilles.   Hoffman's is absent.  Clonus is not present.   Bilateral upper and lower extremity sensation is intact to light touch.     She ambulates with limp favoring right knee.   Medical Decision Making  Imaging: Cervical xrays dated 11/21/23:  FINDINGS: There is osseous fusion of the C2 and C3 posterior elements. There is a  hypoplastic disc at C2-3 and osseous fusion of the anterior and posterior C2-3 disc level.   No sagittal spondylolisthesis.   The atlantodens interval is intact.   Facet joint sclerosis and hypertrophy greatest at C4-5 through C6-7. Likely bilateral C3-4 neuroforaminal narrowing on oblique views.   No prevertebral soft tissue swelling.  The lung apices are clear.   IMPRESSION: 1. Osseous fusion of the C2 and C3 posterior elements and anterior and posterior C2-3 disc space fusion with hypoplastic disc. 2. Facet joint arthropathy greatest at C4-5 through C6-7.     Electronically Signed   By: Neita Garnet M.D.   On: 11/26/2023 12:40   Cervical MRI dated 11/28/23:  FINDINGS: Alignment: Cervical spine straightening. Trace anterolisthesis of C5 on C6.   Vertebrae: Congenital C2-3 fusion. No fracture, suspicious marrow lesion, or significant marrow edema.  Cord: Normal signal.   Posterior Fossa, vertebral arteries, paraspinal tissues: Unremarkable.   Disc levels:   C2-3: Congenital fusion with only a rudimentary disc.  No stenosis.   C3-4: Disc bulging, uncovertebral spurring, and mild facet arthrosis result in mild spinal stenosis without neural foraminal stenosis.   C4-5: Mild disc bulging and moderate left facet arthrosis result in mild left neural foraminal stenosis without spinal stenosis.   C5-6: Mild disc bulging and moderate left facet arthrosis result in mild left neural foraminal stenosis and borderline to mild spinal stenosis.   C6-7: Tiny central disc protrusion without stenosis.   C7-T1: Negative.   T1-2: Negative.   IMPRESSION: 1. Mild cervical spondylosis and moderate facet arthrosis with mild spinal stenosis at C3-4. 2. Mild left neural foraminal stenosis at C4-5 and C5-6.     Electronically Signed   By: Sebastian Ache M.D.   On: 11/28/2023 13:16  I have personally reviewed the images and agree with the above interpretation.  Assessment and  Plan: Ms. Babinski started with constant neck and arm pain along with headaches after a plaque fell off the wall and landed on her head back in January. Headaches have mostly resolved- only are occasional.   She still has constant neck and bilateral shoulder pain that is improving. No arm pain. No numbness, tingling, or weakness.   She has known congenital fusion C2-C3 with mild central stenosis C3-C4, mild left foraminal stenosis C4-C5, and mild central stenosis C5-C6 with mild left foraminal stenosis.   She also has known FM that complicates her clinical picture. Above injury likely aggravated her underlying cervical spondylosis.   Treatment options discussed with patient and following plan made:   - She should continue to improve with time.  - PT for cervical spine discussed. She declines for now.  - If pain gets worse, may consider referral to pain management (Lateef) to discuss injections. She will let me know.  - She sees rheumatology at [facility] and would like to transfer to [facility] provider. Referral done for [facility] in [place].  - She will follow up with me prn.   I spent a total of 35 minutes in face-to-face and non-face-to-face activities related to this patient's care today including review of outside records, review of imaging, review of symptoms, physical exam, discussion of differential diagnosis, discussion of treatment options, and documentation.   Thank you for involving me in the care of this patient.   Drake Leach PA-C Dept. of Neurosurgery

## 2023-12-31 ENCOUNTER — Encounter: Payer: Self-pay | Admitting: Family Medicine

## 2024-01-05 ENCOUNTER — Encounter: Payer: Self-pay | Admitting: Orthopedic Surgery

## 2024-01-05 ENCOUNTER — Ambulatory Visit: Payer: Medicare Other | Admitting: Podiatry

## 2024-01-05 ENCOUNTER — Ambulatory Visit: Payer: Medicare Other | Admitting: Orthopedic Surgery

## 2024-01-05 VITALS — BP 130/82 | Ht 60.0 in | Wt 145.0 lb

## 2024-01-05 DIAGNOSIS — M109 Gout, unspecified: Secondary | ICD-10-CM

## 2024-01-05 DIAGNOSIS — M4802 Spinal stenosis, cervical region: Secondary | ICD-10-CM | POA: Diagnosis not present

## 2024-01-05 DIAGNOSIS — M47812 Spondylosis without myelopathy or radiculopathy, cervical region: Secondary | ICD-10-CM

## 2024-01-05 DIAGNOSIS — W208XXA Other cause of strike by thrown, projected or falling object, initial encounter: Secondary | ICD-10-CM

## 2024-01-05 DIAGNOSIS — M797 Fibromyalgia: Secondary | ICD-10-CM | POA: Diagnosis not present

## 2024-01-05 NOTE — Patient Instructions (Signed)
 It was so nice to see you today. Thank you so much for coming in.    You have wear and tear in your neck (arthritis). I think this was aggravated with your injury.   I would expect your pain to continue to improve.   If not, we can consider physical therapy. Can also consider a referral to pain management (Dr. Cherylann Ratel) to consider injections.   I put in a referral to [facility]. See their contact information below. They should call you.   I will leave your follow up with me open, but please do not hesitate to call if you have any questions or concerns. You can also message me in MyChart.   Drake Leach PA-C [phone]     The physicians and staff at [facility] at [facility] are committed to providing excellent care. You may receive a survey asking for feedback about your experience at our office. We value you your feedback and appreciate you taking the time to to fill it out. The [facility] leadership team is also available to discuss your experience in person, feel free to contact us [phone].

## 2024-01-07 ENCOUNTER — Ambulatory Visit (INDEPENDENT_AMBULATORY_CARE_PROVIDER_SITE_OTHER)

## 2024-01-07 ENCOUNTER — Ambulatory Visit: Payer: Medicare Other | Admitting: Podiatry

## 2024-01-07 ENCOUNTER — Encounter: Payer: Self-pay | Admitting: Podiatry

## 2024-01-07 ENCOUNTER — Ambulatory Visit: Payer: Medicare Other | Admitting: Dermatology

## 2024-01-07 DIAGNOSIS — M19072 Primary osteoarthritis, left ankle and foot: Secondary | ICD-10-CM

## 2024-01-07 DIAGNOSIS — W908XXA Exposure to other nonionizing radiation, initial encounter: Secondary | ICD-10-CM | POA: Diagnosis not present

## 2024-01-07 DIAGNOSIS — L578 Other skin changes due to chronic exposure to nonionizing radiation: Secondary | ICD-10-CM

## 2024-01-07 DIAGNOSIS — M779 Enthesopathy, unspecified: Secondary | ICD-10-CM

## 2024-01-07 DIAGNOSIS — L820 Inflamed seborrheic keratosis: Secondary | ICD-10-CM

## 2024-01-07 DIAGNOSIS — L219 Seborrheic dermatitis, unspecified: Secondary | ICD-10-CM | POA: Diagnosis not present

## 2024-01-07 DIAGNOSIS — L719 Rosacea, unspecified: Secondary | ICD-10-CM

## 2024-01-07 DIAGNOSIS — Z85828 Personal history of other malignant neoplasm of skin: Secondary | ICD-10-CM

## 2024-01-07 DIAGNOSIS — M19071 Primary osteoarthritis, right ankle and foot: Secondary | ICD-10-CM

## 2024-01-07 MED ORDER — KETOCONAZOLE 2 % EX CREA: 1.0000 | TOPICAL_CREAM | Freq: Two times a day (BID) | CUTANEOUS | 0 refills | Status: AC

## 2024-01-07 MED ORDER — METRONIDAZOLE 0.75 % EX CREA: TOPICAL_CREAM | Freq: Two times a day (BID) | CUTANEOUS | 2 refills | Status: DC

## 2024-01-07 NOTE — Patient Instructions (Addendum)

## 2024-01-07 NOTE — Progress Notes (Signed)
 Subjective:  Patient ID: Grace Wheeler, female    DOB: [birth year],  MRN: [id number]  Chief Complaint  Patient presents with   Foot Pain    "I have problems with my ankles.  I do have Arthritis and Fibromyalgia."    Discussed the use of AI scribe software for clinical note transcription with the patient, who gave verbal consent to proceed.  History of Present Illness   Grace Wheeler is a 78 year old female with arthritis and fibromyalgia who presents with bilateral ankle pain and swelling.  She experiences bilateral ankle pain and swelling, described as excruciating when inflamed. The pain is located on the lateral aspects of both ankles and varies depending on weight-bearing. Swelling occurs frequently. Approximately two months ago, she ceased walking and using her stationary bike due to the severity of the pain, which has not alleviated her knee condition.  She has a history of fibromyalgia and underwent a total knee replacement. Post-surgery, she was encouraged to engage in walking and cycling, but the ankle pain has significantly hindered her ability to follow this regimen. She is concerned about her knee replacement, performed five months ago, as it has not yielded the expected improvement. 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  She has not received any steroid injections in her ankles, although she has had them in her knees, shoulders, and wrists. No allergies or reactions to steroid injections. Approximately 35 years ago, she was advised to consider subtalar fusion surgery, which she declined due to concerns about potential changes in her gait.  No pain in the anterior joint line of the ankle and no tenderness in the ankle joint itself.          Objective:    Physical Exam   EXTREMITIES: Feet warm and level, palpable pulses, good capillary refill time, mild varicose veins  present. MUSCULOSKELETAL: Pain and stiffness in subtalar joint, good range of motion in ankle joint, no tenderness in ankle joint or anterior joint line.       No images are attached to the encounter.    Results   RADIOLOGY Left foot and ankle radiograph: Mild degenerative changes in the medial gutter of the ankle joint and subtalar joint spacing area with subchondral sclerosis and periarticular spurring (01/07/2024)      Assessment:   1. Tendonitis   2. Arthritis of both subtalar joints [M19.071, M19.072]      Plan:  Patient was evaluated and treated and all questions answered.  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 Radiographs show mild degenerative changes, subchondral sclerosis, and periarticular spurring. Arthritis is not end-stage. Corticosteroid injections discussed for pain relief and to assess arthritis severity. Surgical options discussed for future consideration if nonoperative treatments fail. She prefers nonoperative management currently. - Administer corticosteroid injections to both subtalar joints with 20 mg of Kenalog, 4 mg of dexamethasone, and 0.5 cc of lidocaine. - Advise rest for today and tomorrow post-injection. - Schedule follow-up visit in three months to assess progress. - Instruct to update if pain relief is short-lived, to consider ordering a CT scan to evaluate joint surfaces. - Encourage resumption of low-impact activities like stationary biking after a couple of days of rest.  Post-total knee replacement status Five months post-total knee replacement with suboptimal recovery due to reduced activity from  ankle pain. - Encourage continuation of knee rehabilitation exercises as tolerated, particularly low-impact activities like stationary biking.  Fibromyalgia Fibromyalgia may contribute to  overall pain experience and complicate ankle arthritis management. Not the primary focus of current treatment plan.          Return in about 3 months (around 04/08/2024) for f/u subtalar arthritis.

## 2024-01-07 NOTE — Patient Instructions (Signed)
 VISIT SUMMARY:  Today, we discussed your bilateral ankle pain and swelling, which has been significantly affecting your mobility and overall quality of life. We reviewed your history of fibromyalgia and recent total knee replacement, and we explored treatment options for your ankle issues.  YOUR PLAN:  -SUBTALAR ARTHRITIS AND CAPSULITIS WITH SINUS TARSI SYNDROME: This condition involves inflammation and arthritis in the subtalar joint, causing significant pain and swelling in your ankles. We administered corticosteroid injections to both subtalar joints to help reduce inflammation and pain. You should rest today and tomorrow, and we will reassess your progress in three months. If the pain relief is short-lived, we may consider a CT scan to evaluate the joint surfaces. After a couple of days of rest, you can resume low-impact activities like stationary biking.  -POST-TOTAL KNEE REPLACEMENT STATUS: You are five months post-total knee replacement and have had a suboptimal recovery due to reduced activity from your ankle pain. Continue with your knee rehabilitation exercises as tolerated, focusing on low-impact activities like stationary biking.  -FIBROMYALGIA: Fibromyalgia is a chronic condition that causes widespread pain and can complicate the management of other conditions like your ankle arthritis. While it is not the primary focus of today's treatment plan, it is important to consider its impact on your overall pain experience.  INSTRUCTIONS:  Please rest today and tomorrow following your corticosteroid injections. Resume low-impact activities like stationary biking after a couple of days. We will have a follow-up visit in three months to assess your progress. If your pain relief is short-lived, please update [place] so we can consider ordering a CT scan to evaluate your joint surfaces.  For more information, you can read your full clinical note, available in your patient portal.

## 2024-01-07 NOTE — Progress Notes (Signed)
 Follow-Up Visit   Subjective  Grace Wheeler is a 78 y.o. female who presents for the following: pt reports hx of Sjogrens disease, gets dry scaly patches at forehead, cheeks, and neck, at time can be itchy. Patient use clinique moisturizer. Pt also has dark spot under R eye and at R jaw present x5 years that continues to grow. Hx BCC  The patient has spots, moles and lesions to be evaluated, some may be new or changing and the patient may have concern these could be cancer.   The following portions of the chart were reviewed this encounter and updated as appropriate: medications, allergies, medical history  Review of Systems:  No other skin or systemic complaints except as noted in HPI or Assessment and Plan.  Objective  Well appearing patient in no apparent distress; mood and affect are within normal limits.   A focused examination was performed of the following areas: Face  Relevant exam findings are noted in the Assessment and Plan.  L glabela x1, R infraoccular x1, L lateral eyebrow x1, R jaw x1, R mid eyebrow x1 (5) Stuck on waxy paps with erythema  Assessment & Plan   ACTINIC DAMAGE - chronic, secondary to cumulative UV radiation exposure/sun exposure over time - diffuse scaly erythematous macules with underlying dyspigmentation - Recommend daily broad spectrum sunscreen SPF 30+ to sun-exposed areas, reapply every 2 hours as needed.  - Recommend staying in the shade or wearing long sleeves, sun glasses (UVA+UVB protection) and wide brim hats (4-inch brim around the entire circumference of the hat). - Call for new or changing lesions.   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 Condition is bothersome/symptomatic for patient. Currently flared.   Rosacea is a chronic progressive skin condition usually affecting the face of adults, causing  redness and/or acne bumps. It is treatable but not curable. It sometimes affects the eyes (ocular rosacea) as well. It may respond to topical and/or systemic medication and can flare with stress, sun exposure, alcohol, exercise, topical steroids (including hydrocortisone/cortisone 10) and some foods.  Daily application of broad spectrum spf 30+ sunscreen to face is recommended to reduce flares.  Patient denies grittiness of the eyes  Treatment Plan Use Metronidazole 0.75% cream apply to aa face 1-2 times per day to face for rosacea,  Discussed adding oral doxycycline if not improving with topical   SEBORRHEIC DERMATITIS Exam: Pink patch with greasy scale at R nasal alar crease, eyebrows.  Chronic and persistent condition with duration or expected duration over one year. Condition is bothersome/symptomatic for patient. Currently flared.   Seborrheic Dermatitis is a chronic persistent rash characterized by pinkness and scaling most commonly of the mid face but also can occur on the scalp (dandruff), ears; mid chest, mid back and groin.  It tends to be exacerbated by stress and cooler weather.  People who have neurologic disease may experience new onset or exacerbation of existing seborrheic dermatitis.  The condition is not curable but treatable and can be controlled.  Treatment Plan: Use Ketoconazole 2% cream to aa, nose, eyebrows for dry flakey patches, 1-2 times per day Use Hydrocortisone cream 2.5% 1-2 times per day prn for flares until rash clear.   INFLAMED SEBORRHEIC KERATOSIS (5) L glabela x1, R infraoccular x1, L lateral eyebrow x1, R jaw x1, R mid eyebrow x1 (5) Symptomatic, irritating, patient would like treated. Destruction of lesion - L glabela  x1, R infraoccular x1, L lateral eyebrow x1, R jaw x1, R mid eyebrow x1 (5)  Destruction method: cryotherapy   Informed consent: discussed and consent obtained   Lesion destroyed using liquid nitrogen: Yes   Region frozen until ice ball  extended beyond lesion: Yes   Outcome: patient tolerated procedure well with no complications   Post-procedure details: wound care instructions given   Additional details:  Prior to procedure, discussed risks of blister formation, small wound, skin dyspigmentation, or rare scar following cryotherapy. Recommend Vaseline ointment to treated areas while healing.   Return for w/ Dr. Roseanne Reno, Rosacea, Seb Derm, TBSE, As scheduled.  I, Soundra Pilon, CMA, am acting as scribe for Willeen Niece, MD .   Documentation: I have reviewed the above documentation for accuracy and completeness, and I agree with the above.  Willeen Niece, MD

## 2024-01-13 ENCOUNTER — Ambulatory Visit
Admission: RE | Admit: 2024-01-13 | Discharge: 2024-01-13 | Disposition: A | Discharge status: Home / Self Care | Payer: Medicare Other | Source: Ambulatory Visit | Attending: Family Medicine | Admitting: Family Medicine

## 2024-01-13 DIAGNOSIS — M8580 Other specified disorders of bone density and structure, unspecified site: Secondary | ICD-10-CM

## 2024-01-13 DIAGNOSIS — Z1231 Encounter for screening mammogram for malignant neoplasm of breast: Secondary | ICD-10-CM | POA: Diagnosis present

## 2024-01-13 DIAGNOSIS — Z780 Asymptomatic menopausal state: Secondary | ICD-10-CM | POA: Insufficient documentation

## 2024-01-13 DIAGNOSIS — M8589 Other specified disorders of bone density and structure, multiple sites: Secondary | ICD-10-CM | POA: Diagnosis not present

## 2024-01-13 DIAGNOSIS — Z13820 Encounter for screening for osteoporosis: Secondary | ICD-10-CM | POA: Insufficient documentation

## 2024-01-14 ENCOUNTER — Encounter: Payer: Self-pay | Admitting: Family Medicine

## 2024-01-14 LAB — CMP14+EGFR
ALT: 12 IU/L (ref 0–32)
AST: 12 IU/L (ref 0–40)
Albumin: 4.2 g/dL (ref 3.8–4.8)
Alkaline Phosphatase: 66 IU/L (ref 44–121)
BUN/Creatinine Ratio: 20 (ref 12–28)
BUN: 18 mg/dL (ref 8–27)
Bilirubin Total: 0.5 mg/dL (ref 0.0–1.2)
CO2: 24 mmol/L (ref 20–29)
Calcium: 10.2 mg/dL (ref 8.7–10.3)
Chloride: 103 mmol/L (ref 96–106)
Creatinine, Ser: 0.91 mg/dL (ref 0.57–1.00)
Globulin, Total: 2.4 g/dL (ref 1.5–4.5)
Glucose: 91 mg/dL (ref 70–99)
Potassium: 4.7 mmol/L (ref 3.5–5.2)
Sodium: 144 mmol/L (ref 134–144)
Total Protein: 6.6 g/dL (ref 6.0–8.5)
eGFR: 66 mL/min/{1.73_m2} (ref >59)

## 2024-01-14 LAB — LIPID PANEL
Chol/HDL Ratio: 2.1 ratio (ref 0.0–4.4)
Cholesterol, Total: 148 mg/dL (ref 100–199)
HDL: 71 mg/dL (ref >39)
LDL Chol Calc (NIH): 59 mg/dL (ref 0–99)
Triglycerides: 102 mg/dL (ref 0–149)
VLDL Cholesterol Cal: 18 mg/dL (ref 5–40)

## 2024-01-14 LAB — TSH+T4F+T3FREE
Free T4: 1.44 ng/dL (ref 0.82–1.77)
T3, Free: 2.9 pg/mL (ref 2.0–4.4)
TSH: 4.94 u[IU]/mL — ABNORMAL HIGH (ref 0.450–4.500)

## 2024-01-14 LAB — CBC
Hematocrit: 43.6 % (ref 34.0–46.6)
Hemoglobin: 14.6 g/dL (ref 11.1–15.9)
MCH: 30 pg (ref 26.6–33.0)
MCHC: 33.5 g/dL (ref 31.5–35.7)
MCV: 90 fL (ref 79–97)
Platelets: 329 10*3/uL (ref 150–450)
RBC: 4.87 x10E6/uL (ref 3.77–5.28)
RDW: 13.7 % (ref 11.7–15.4)
WBC: 8.6 10*3/uL (ref 3.4–10.8)

## 2024-01-14 LAB — HEMOGLOBIN A1C
Est. average glucose Bld gHb Est-mCnc: 111 mg/dL
Hgb A1c MFr Bld: 5.5 % (ref 4.8–5.6)

## 2024-01-14 LAB — VITAMIN D 25 HYDROXY (VIT D DEFICIENCY, FRACTURES): Vit D, 25-Hydroxy: 39.3 ng/mL (ref 30.0–100.0)

## 2024-01-21 ENCOUNTER — Ambulatory Visit (INDEPENDENT_AMBULATORY_CARE_PROVIDER_SITE_OTHER): Payer: Medicare Other | Admitting: Family Medicine

## 2024-01-21 ENCOUNTER — Encounter: Payer: Self-pay | Admitting: Family Medicine

## 2024-01-21 VITALS — BP 114/58 | HR 58 | Ht 60.0 in | Wt 144.0 lb

## 2024-01-21 DIAGNOSIS — Z0000 Encounter for general adult medical examination without abnormal findings: Secondary | ICD-10-CM | POA: Diagnosis not present

## 2024-01-21 DIAGNOSIS — Z1211 Encounter for screening for malignant neoplasm of colon: Secondary | ICD-10-CM | POA: Diagnosis not present

## 2024-01-21 NOTE — Patient Instructions (Signed)
 Osteopenia  Osteopenia is a loss of thickness (density) inside the bones. Another name for osteopenia is low bone mass. Mild osteopenia is a normal part of aging. It is not a disease, and it does not cause symptoms. However, if you have osteopenia and continue to lose bone mass, you could develop a condition that causes the bones to become thin and break more easily (osteoporosis). Osteoporosis can cause you to lose some height, have back pain, and have a stooped posture. Although osteopenia is not a disease, making changes to your lifestyle and diet can help to prevent osteopenia from developing into osteoporosis. What are the causes? Osteopenia is caused by loss of calcium in the bones. Bones are constantly changing. Old bone cells are continually being replaced with new bone cells. This process builds new bone. The mineral calcium is needed to build new bone and maintain bone density. Bone density is usually highest around age 24. After that, most people's bodies cannot replace all the bone they have lost with new bone. What increases the risk? You are more likely to develop this condition if: You are older than age 98. You are a woman who went through menopause early. You have a long illness that keeps you in bed. You do not get enough exercise. You lack certain nutrients (malnutrition). You have an overactive thyroid gland (hyperthyroidism). You use products that contain nicotine or tobacco, such as cigarettes, e-cigarettes and chewing tobacco, or you drink a lot of alcohol. You are taking medicines that weaken the bones, such as steroids. What are the signs or symptoms? This condition does not cause any symptoms. You may have a slightly higher risk for bone breaks (fractures), so getting fractures more easily than normal may be an indication of osteopenia. How is this diagnosed? This condition may be diagnosed based on an X-ray exam that measures bone density (dual-energy X-ray  absorptiometry, or DEXA). This test can measure bone density in your hips, spine, and wrists. Osteopenia has no symptoms, so this condition is usually diagnosed after a routine bone density screening test is done for osteoporosis. This routine screening is usually done for: Women who are age 11 or older. Men who are age 24 or older. If you have risk factors for osteopenia, you may have the screening test at an earlier age. How is this treated? Making dietary and lifestyle changes can lower your risk for osteoporosis. If you have severe osteopenia that is close to becoming osteoporosis, this condition can be treated with medicines and dietary supplements such as calcium and vitamin D. These supplements help to rebuild bone density. Follow these instructions at home: Eating and drinking Eat a diet that is high in calcium and vitamin D. Calcium is found in dairy products, beans, salmon, and leafy green vegetables like spinach and broccoli. Look for foods that have vitamin D and calcium added to them (fortified foods), such as orange juice, cereal, and bread.  Lifestyle Do 30 minutes or more of a weight-bearing exercise every day, such as walking, jogging, or playing a sport. These types of exercises strengthen the bones. Do not use any products that contain nicotine or tobacco, such as cigarettes, e-cigarettes, and chewing tobacco. If you need help quitting, ask your health care provider. Do not drink alcohol if: Your health care provider tells you not to drink. You are pregnant, may be pregnant, or are planning to become pregnant. If you drink alcohol: Limit how much you use to: 0-1 drink a day for women. 0-2  drinks a day for men. Be aware of how much alcohol is in your drink. In the [place] one drink equals one 12 oz bottle of beer (355 mL), one 5 oz glass of wine (148 mL), or one 1 oz glass of hard liquor (44 mL). General instructions Take over-the-counter and prescription medicines only as  told by your health care provider. These include vitamins and supplements. Take precautions at home to lower your risk of falling, such as: Keeping rooms well-lit and free of clutter, such as cords. Installing safety rails on stairs. Using rubber mats in the bathroom or other areas that are often wet or slippery. Keep all follow-up visits. This is important. Contact a health care provider if: You have not had a bone density screening for osteoporosis and you are: A woman who is age 109 or older. A man who is age 83 or older. You are a postmenopausal woman who has not had a bone density screening for osteoporosis. You are older than age 50 and you want to know if you should have bone density screening for osteoporosis. Summary Osteopenia is a loss of thickness (density) inside the bones. Another name for osteopenia is low bone mass. Osteopenia is not a disease, but it may increase your risk for a condition that causes the bones to become thin and break more easily (osteoporosis). You may be at risk for osteopenia if you are older than age 67 or if you are a woman who went through early menopause. Osteopenia does not cause any symptoms, but it can be diagnosed with a bone density screening test. Dietary and lifestyle changes are the first treatment for osteopenia. These may lower your risk for osteoporosis. This information is not intended to replace advice given to you by your health care provider. Make sure you discuss any questions you have with your health care provider. Document Revised: 07/08/2023 Document Reviewed: 07/08/2023 Elsevier Patient Education  2024 [employer].

## 2024-01-21 NOTE — Progress Notes (Unsigned)
 Subjective:   Grace Wheeler is a 78 y.o. female who presents for Medicare Annual (Subsequent) preventive examination.  Visit Complete: In person  Patient Medicare AWV questionnaire was completed by the patient on 01/21/24; I have confirmed that all information answered by patient is correct and no changes since this date.  Cardiac Risk Factors include: family history of premature cardiovascular disease;hypertension     Objective:    Today's Vitals   01/21/24 1344  BP: (!) 114/58  Pulse: (!) 58  SpO2: 98%  Weight: 144 lb (65.3 kg)  Height: 5' (1.524 m)  PainSc: 0-No pain   Body mass index is 28.12 kg/m.      No data to display           Current Medications (verified) Outpatient Encounter Medications as of 01/21/2024  Medication Sig   acetaminophen (TYLENOL) 650 MG CR tablet Tylenol Arthritis Pain 650 mg tablet,extended release  Take 1 tablet twice a day by oral route.   clonazePAM (KLONOPIN) 1 MG tablet TAKE 1 TABLET BY MOUTH AT  BEDTIME   colchicine 0.6 MG tablet    cycloSPORINE (RESTASIS) 0.05 % ophthalmic emulsion    diclofenac Sodium (VOLTAREN) 1 % GEL diclofenac 1 % topical gel  APPLY 2 GRAM TO THE AFFECTED AREA(S) BY TOPICAL ROUTE 4 TIMES PER DAY   gabapentin (NEURONTIN) 100 MG capsule Take 3 capsules (300 mg total) by mouth 2 (two) times daily.   hydrochlorothiazide (HYDRODIURIL) 25 MG tablet TAKE 1 TABLET BY MOUTH DAILY   hydroxychloroquine (PLAQUENIL) 200 MG tablet hydroxychloroquine 200 mg tablet  TAKE 1 TABLET BY MOUTH  DAILY   ketoconazole (NIZORAL) 2 % cream Apply 1 Application topically 2 (two) times daily. Apply 1-2 times daily to aa, nose, eyebrows.   Light Mineral Oil-Mineral Oil 0.5-0.5 % EMUL Retaine MGD (PF) 0.5 %-0.5 % eye drops in a dropperette  As Needed   Meloxicam 7.5 MG TBDP    metoprolol succinate (TOPROL-XL) 50 MG 24 hr tablet metoprolol succinate ER 50 mg tablet,extended release 24 hr  TAKE 1 TABLET BY MOUTH AT  BEDTIME   metroNIDAZOLE  (METROCREAM) 0.75 % cream Apply topically 2 (two) times daily. Apply to aa face 1-2 times per day   rosuvastatin (CRESTOR) 10 MG tablet Take 1 tablet (10 mg total) by mouth daily.   No facility-administered encounter medications on file as of 01/21/2024.    Allergies (verified) Zolpidem, Baclofen, Levofloxacin, Orphenadrine, Pregabalin, Tizanidine, Trazodone, Trazodone and nefazodone, and Celecoxib   History: Past Medical History:  Diagnosis Date   Arthritis    Autoimmune disease (HCC)    multiple autoimmune system problems   Basal cell carcinoma 10/23/2022   Left upper arm. Nodular, infiltrative type. EDC   Blood transfusion without reported diagnosis    Fibromyalgia    GERD (gastroesophageal reflux disease)    Hypertension    Lumbar spondylitis (HCC)    Raynaud's disease    Sjogren's disease (HCC)    TMJ (dislocation of temporomandibular joint)    Past Surgical History:  Procedure Laterality Date   ABDOMINAL HYSTERECTOMY     BLADDER SURGERY     BURCH PROCEDURE     CARPAL TUNNEL RELEASE Bilateral    Family History  Problem Relation Age of Onset   Breast cancer Mother    Cancer Mother    Heart failure Father    Cancer Maternal Grandmother    Cancer Maternal Grandfather    Heart failure Brother    Heart failure Brother  Heart failure Brother    Heart failure Brother    Social History   Socioeconomic History   Marital status: Married    Spouse name: Not on file   Number of children: Not on file   Years of education: Not on file   Highest education level: Associate degree: occupational, Scientist, product/process development, or vocational program  Occupational History   Not on file  Tobacco Use   Smoking status: Never   Smokeless tobacco: Never  Substance and Sexual Activity   Alcohol use: Never   Drug use: Never   Sexual activity: Not on file  Other Topics Concern   Not on file  Social History Narrative   Not on file   Social Drivers of Health   Financial Resource Strain: Low  Risk  (11/12/2023)   Overall Financial Resource Strain (CARDIA)    Difficulty of Paying Living Expenses: Not very hard  Food Insecurity: No Food Insecurity (11/12/2023)   Hunger Vital Sign    Worried About Running Out of Food in the Last Year: Never true    Ran Out of Food in the Last Year: Never true  Transportation Needs: No Transportation Needs (11/12/2023)   PRAPARE - Administrator, Civil Service (Medical): No    Lack of Transportation (Non-Medical): No  Physical Activity: Insufficiently Active (11/12/2023)   Exercise Vital Sign    Days of Exercise per Week: 5 days    Minutes of Exercise per Session: 20 min  Stress: No Stress Concern Present (01/21/2024)   [employer] of Occupational Health - Occupational Stress Questionnaire    Feeling of Stress : Only a little  Recent Concern: Stress - Stress Concern Present (11/12/2023)   [employer] of Occupational Health - Occupational Stress Questionnaire    Feeling of Stress : To some extent  Social Connections: Socially Integrated (11/12/2023)   Social Connection and Isolation Panel [NHANES]    Frequency of Communication with Friends and Family: Three times a week    Frequency of Social Gatherings with Friends and Family: Once a week    Attends Religious Services: More than 4 times per year    Active Member of [employer] or Organizations: Yes    Attends Engineer, structural: More than 4 times per year    Marital Status: Married    Tobacco Counseling Counseling given: Not Answered   Clinical Intake:  Pre-visit preparation completed: No  Pain Score: 0-No pain     BMI - recorded: 28.12 Nutritional Status: BMI 25 -29 Overweight Nutritional Risks: None Diabetes: No  How often do you need to have someone help you when you read instructions, pamphlets, or other written materials from your doctor or pharmacy?: 1 - Never  Interpreter Needed?: No      Activities of Daily Living    01/21/2024    1:49 PM  In  your present state of health, do you have any difficulty performing the following activities:  Hearing? 1  Vision? 0  Difficulty concentrating or making decisions? 0  Walking or climbing stairs? 1  Comment had a knee replaced  Dressing or bathing? 0  Doing errands, shopping? 0  Preparing Food and eating ? N  Using the Toilet? N  In the past six months, have you accidently leaked urine? Y  Do you have problems with loss of bowel control? N  Managing your Medications? N  Managing your Finances? N  Housekeeping or managing your Housekeeping? N    Patient Care Team: [name], MD  as PCP - General (Family Medicine) Willeen Niece, MD (Dermatology) Patterson Hammersmith, MD as Consulting Physician (Rheumatology)  Indicate any recent Medical Services you may have received from other than [facility] providers in the past year (date may be approximate).    Physical Exam   HEENT: Throat normal. CHEST: Lungs normal. CARDS:RRR NEUROLOGICAL: Cranial nerves normal. Motor function normal. Shoulder shrug strength normal. Facial muscles normal. Cheek puff strength normal. Leg lift and jaw clench normal.         Assessment:   This is a routine wellness examination for [place].  Hearing/Vision screen No results found.   Goals Addressed   None   Depression Screen    01/21/2024    1:44 PM 11/26/2023    2:13 PM 11/19/2023    1:10 PM 12/25/2022    1:13 PM 12/04/2022    1:14 PM 08/02/2022    1:28 PM 07/02/2022   10:28 AM  PHQ 2/9 Scores  PHQ - 2 Score 0 0 2 0 0 0 0  PHQ- 9 Score 2 6 9  0 2 5 4     Fall Risk    01/21/2024    1:45 PM 11/26/2023    2:13 PM 11/19/2023    1:10 PM 06/25/2023    1:12 PM 12/04/2022    1:14 PM  Fall Risk   Falls in the past year? 0 0 0 0 0  Number falls in past yr:  0 0 0 0  Injury with Fall? 0 0 0 0 0  Risk for fall due to :   No Fall Risks No Fall Risks No Fall Risks  Follow up   Falls evaluation completed Falls evaluation completed     MEDICARE RISK AT  HOME:    TIMED UP AND GO:  Was the test performed?  No    Cognitive Function:        Immunizations Immunization History  Administered Date(s) Administered   Influenza, High Dose Seasonal PF 08/20/2019   Influenza-Unspecified 07/18/2022   Moderna Sars-Covid-2 Vaccination 12/14/2019, 01/11/2020   Pneumococcal Polysaccharide-23 04/07/2019   Zoster Recombinant(Shingrix) 07/18/2022, 09/18/2022    TDAP status: Due, Education has been provided regarding the importance of this vaccine. Advised may receive this vaccine at local pharmacy or Health Dept. Aware to provide a copy of the vaccination record if obtained from local pharmacy or Health Dept. Verbalized acceptance and understanding.  Flu Vaccine status: Declined, Education has been provided regarding the importance of this vaccine but patient still declined. Advised may receive this vaccine at local pharmacy or Health Dept. Aware to provide a copy of the vaccination record if obtained from local pharmacy or Health Dept. Verbalized acceptance and understanding.  Pneumococcal vaccine status: Up to date  Covid-19 vaccine status: Completed vaccines  Qualifies for Shingles Vaccine? Yes   Zostavax completed Yes   Shingrix Completed?: Yes  Screening Tests Health Maintenance  Topic Date Due   Hepatitis C Screening  Never done   DTaP/Tdap/Td (1 - Tdap) Never done   COVID-19 Vaccine (3 - Moderna risk series) 02/08/2020   INFLUENZA VACCINE  02/02/2024 (Originally 06/05/2023)   Pneumonia Vaccine 25+ Years old (2 of 2 - PCV) 11/18/2024 (Originally 04/06/2020)   Medicare Annual Wellness (AWV)  01/20/2025   Colonoscopy  10/16/2028   DEXA SCAN  Completed   Zoster Vaccines- Shingrix  Completed   HPV VACCINES  Aged Out    Health Maintenance  Health Maintenance Due  Topic Date Due   Hepatitis C Screening  Never done  DTaP/Tdap/Td (1 - Tdap) Never done   COVID-19 Vaccine (3 - Moderna risk series) 02/08/2020    Colorectal cancer  screening: Type of screening: Colonoscopy. Completed 2019 at outside facility. Repeat every 10 years  Mammogram status: Completed 01/13/24. Repeat every year  Bone Density status: Completed 01/13/24. Results reflect: Bone density results: OSTEOPENIA. Repeat every 2  years.  Lung Cancer Screening: (Low Dose CT Chest recommended if Age 13-80 years, 20 pack-year currently smoking OR have quit w/in 15years.) does not qualify.   Lung Cancer Screening Referral: N/A  Additional Screening:  Hepatitis C Screening: not completed   Vision Screening: Recommended annual ophthalmology exams for early detection of glaucoma and other disorders of the eye. Is the patient up to date with their annual eye exam?  Yes   If pt is not established with a provider, would they like to be referred to a provider to establish care? No .   Dental Screening: Recommended annual dental exams for proper oral hygiene  Diabetic Foot Exam: N/A      Plan:     I have personally reviewed and noted the following in the patient's chart:   Medical and social history Use of alcohol, tobacco or illicit drugs  Current medications and supplements including opioid prescriptions. Patient is not currently taking opioid prescriptions. Functional ability and status Nutritional status Physical activity Advanced directives List of other physicians Hospitalizations, surgeries, and ER visits in previous 12 months Vitals Screenings to include cognitive, depression, and falls Referrals and appointments  Assessment and Plan    Pseudogout Pseudogout in the hand, previously misdiagnosed as gout. Rheumatologist confirmed diagnosis and prescribed colchicine, leading to improvement in symptoms. Initial concern for septic arthritis was ruled out.  Nonalcoholic Fatty Liver Disease (NAFLD) NAFLD indicated by liver function tests. Liver enzymes are currently normal, indicating no immediate liver damage. Emphasized dietary modifications  to reduce carbohydrate intake to help reverse fatty changes in the liver. - Advise dietary modifications to reduce carbohydrate intake.  Osteopenia Osteopenia with a T-score of -1.7. Discussed the importance of weight-bearing exercises to strengthen bones and muscles. She has multiple comorbidities including arthritis, fibromyalgia, and tendonitis, limiting exercise capacity. Calcium supplementation is not recommended due to risk of pseudogout. Emphasized the importance of maintaining activity to prevent further deconditioning and muscle loss. - Recommend chair exercises and light weightlifting to improve muscle strength. - Continue vitamin D supplementation.  Subclinical Hypothyroidism Mildly elevated TSH at 4.9 with normal T3 and T4 levels. No treatment required at this time as the elevation is not significant enough to warrant medication. Discussed subclinical hypothyroidism and the threshold for treatment, noting that treatment is typically considered if TSH levels reach 6 or 7.  General Health Maintenance Cholesterol levels are excellent, and A1c is normal at 5.5. Sodium level is slightly elevated but not concerning. Mammogram was negative for malignancy. Discussed the importance of tetanus vaccination and COVID-19 vaccination, but she declined further COVID-19 vaccination. - Recommend tetanus vaccination. - Submit referral to gastroenterologist for colonoscopy evaluation.  Follow-up Agreed to check-in in September for regular follow-up. - Schedule follow-up appointment in September for regular check-in.         In addition, I have reviewed and discussed with patient certain preventive protocols, quality metrics, and best practice recommendations. A written personalized care plan for preventive services as well as general preventive health recommendations were provided to patient.     [name], MD   01/21/2024

## 2024-01-25 ENCOUNTER — Other Ambulatory Visit: Payer: Self-pay | Admitting: Family Medicine

## 2024-01-26 NOTE — Telephone Encounter (Signed)
 Requested Prescriptions  Refused Prescriptions Disp Refills   gabapentin (NEURONTIN) 100 MG capsule [Pharmacy Med Name: Gabapentin 100 MG Oral Capsule] 540 capsule 3    Sig: TAKE 3 CAPSULES BY MOUTH TWICE  DAILY     Neurology: Anticonvulsants - gabapentin Passed - 01/26/2024  5:20 PM      Passed - Cr in normal range and within 360 days    Creatinine, Ser  Date Value Ref Range Status  01/13/2024 0.91 0.57 - 1.00 mg/dL Final         Passed - Completed PHQ-2 or PHQ-9 in the last 360 days      Passed - Valid encounter within last 12 months    Recent Outpatient Visits           2 months ago Cervical vertebral fusion   [facility] [place], Monico Blitz, DO   2 months ago Injury of head, initial encounter   [place] [facility] [place]Rob[place], [place], MD   7 months ago Primary hypertension   [place] [facility] Simmons-Robinson, [place], MD   9 months ago Cough, unspecified type   [facility] [place], [place], PA-C   1 year ago Encounter for annual wellness exam in Medicare patient   [facility] [place], Tawanna Cooler, MD       Future Appointments             In 4 months Willeen Niece, MD [facility]   In 5 months Simmons-Robinson, Tawanna Cooler, MD [facility]

## 2024-01-27 IMAGING — CR DG CHEST 2V
1 series · 2 of 2 positions shown · non-contrast
Comparison: None Available.

CLINICAL DATA: Cough and shortness of breath.

EXAM:
CHEST - 2 VIEW

[Series 1: dg chest 2 view · 0.14mm/px · 2 of 2 slices shown]
[im 1/2]
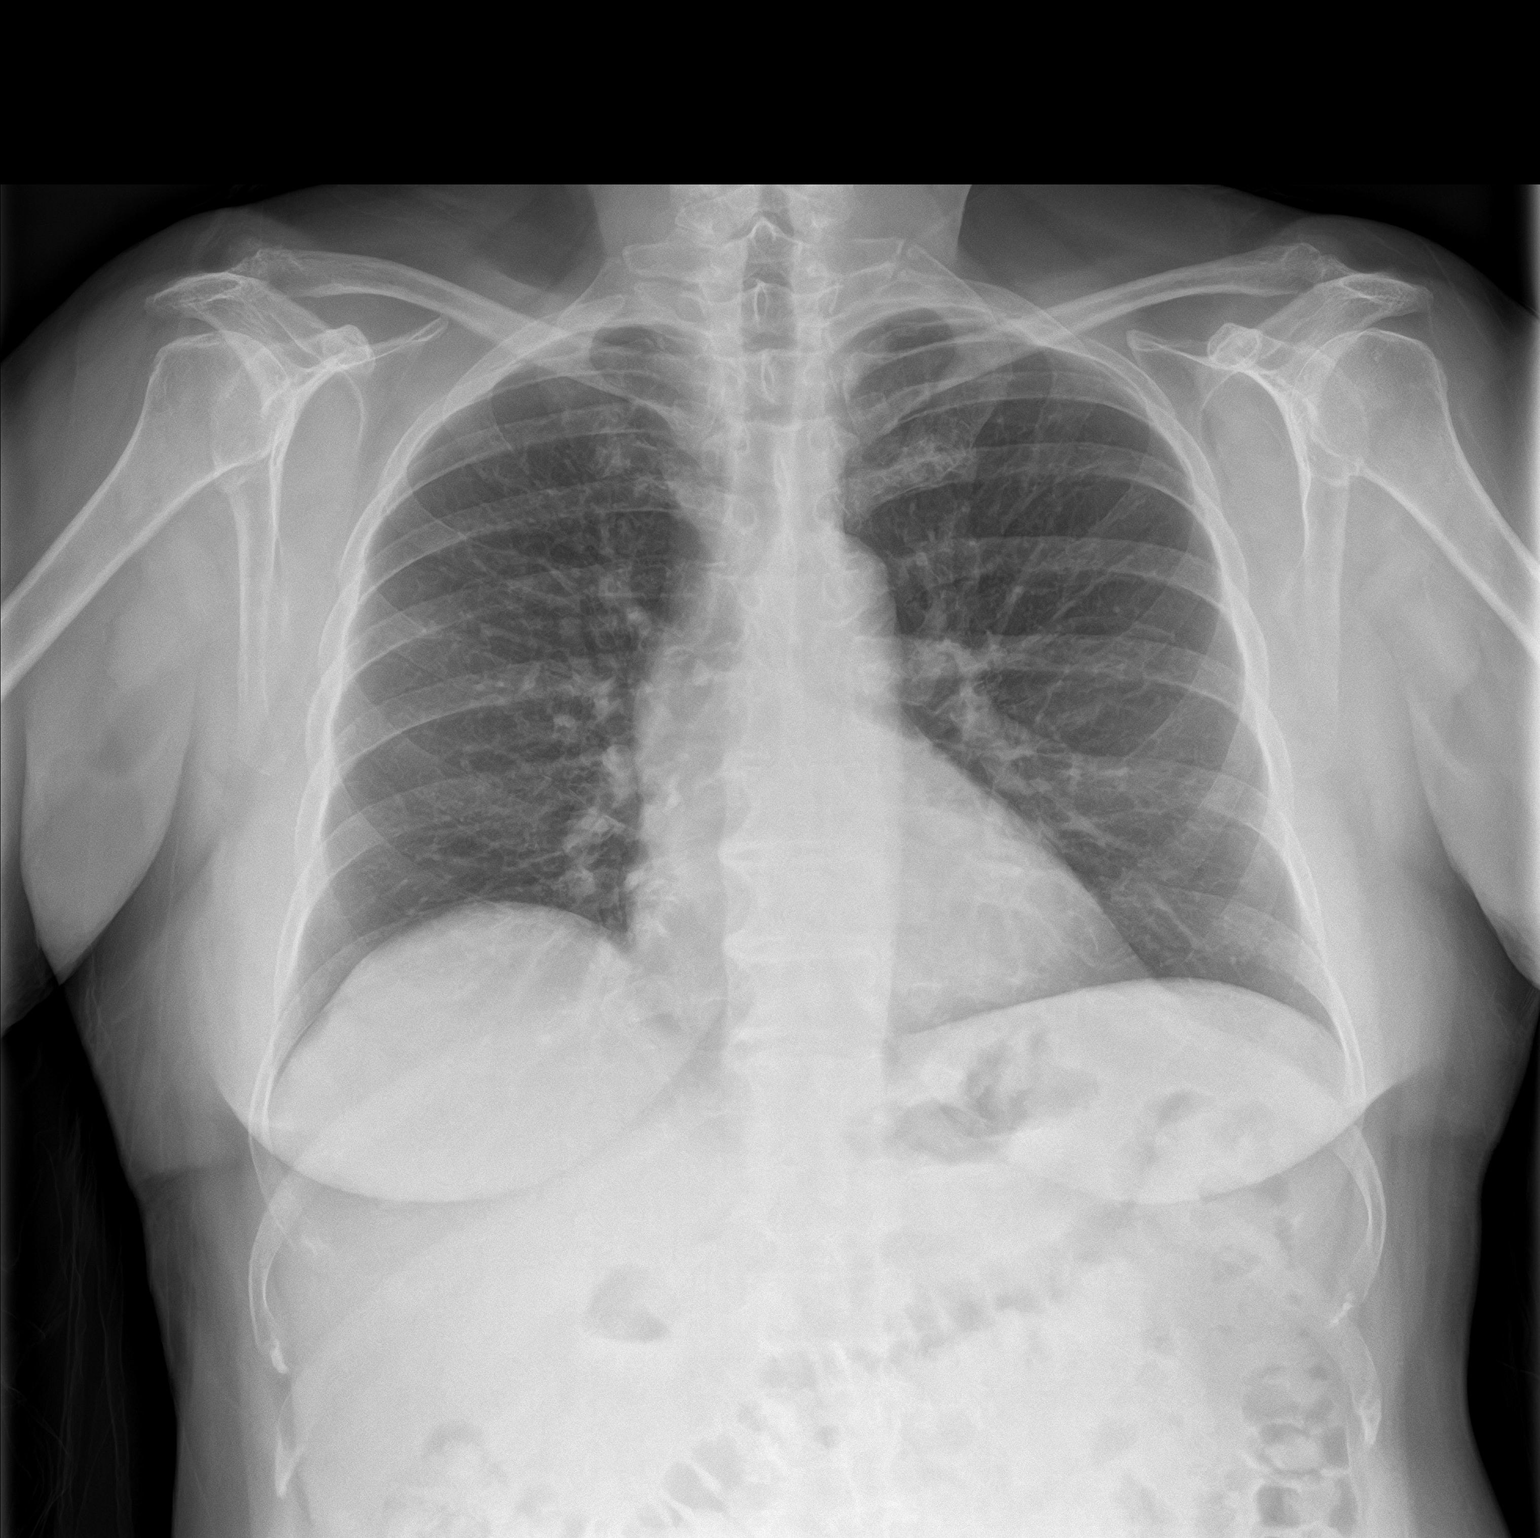
[im 2/2]
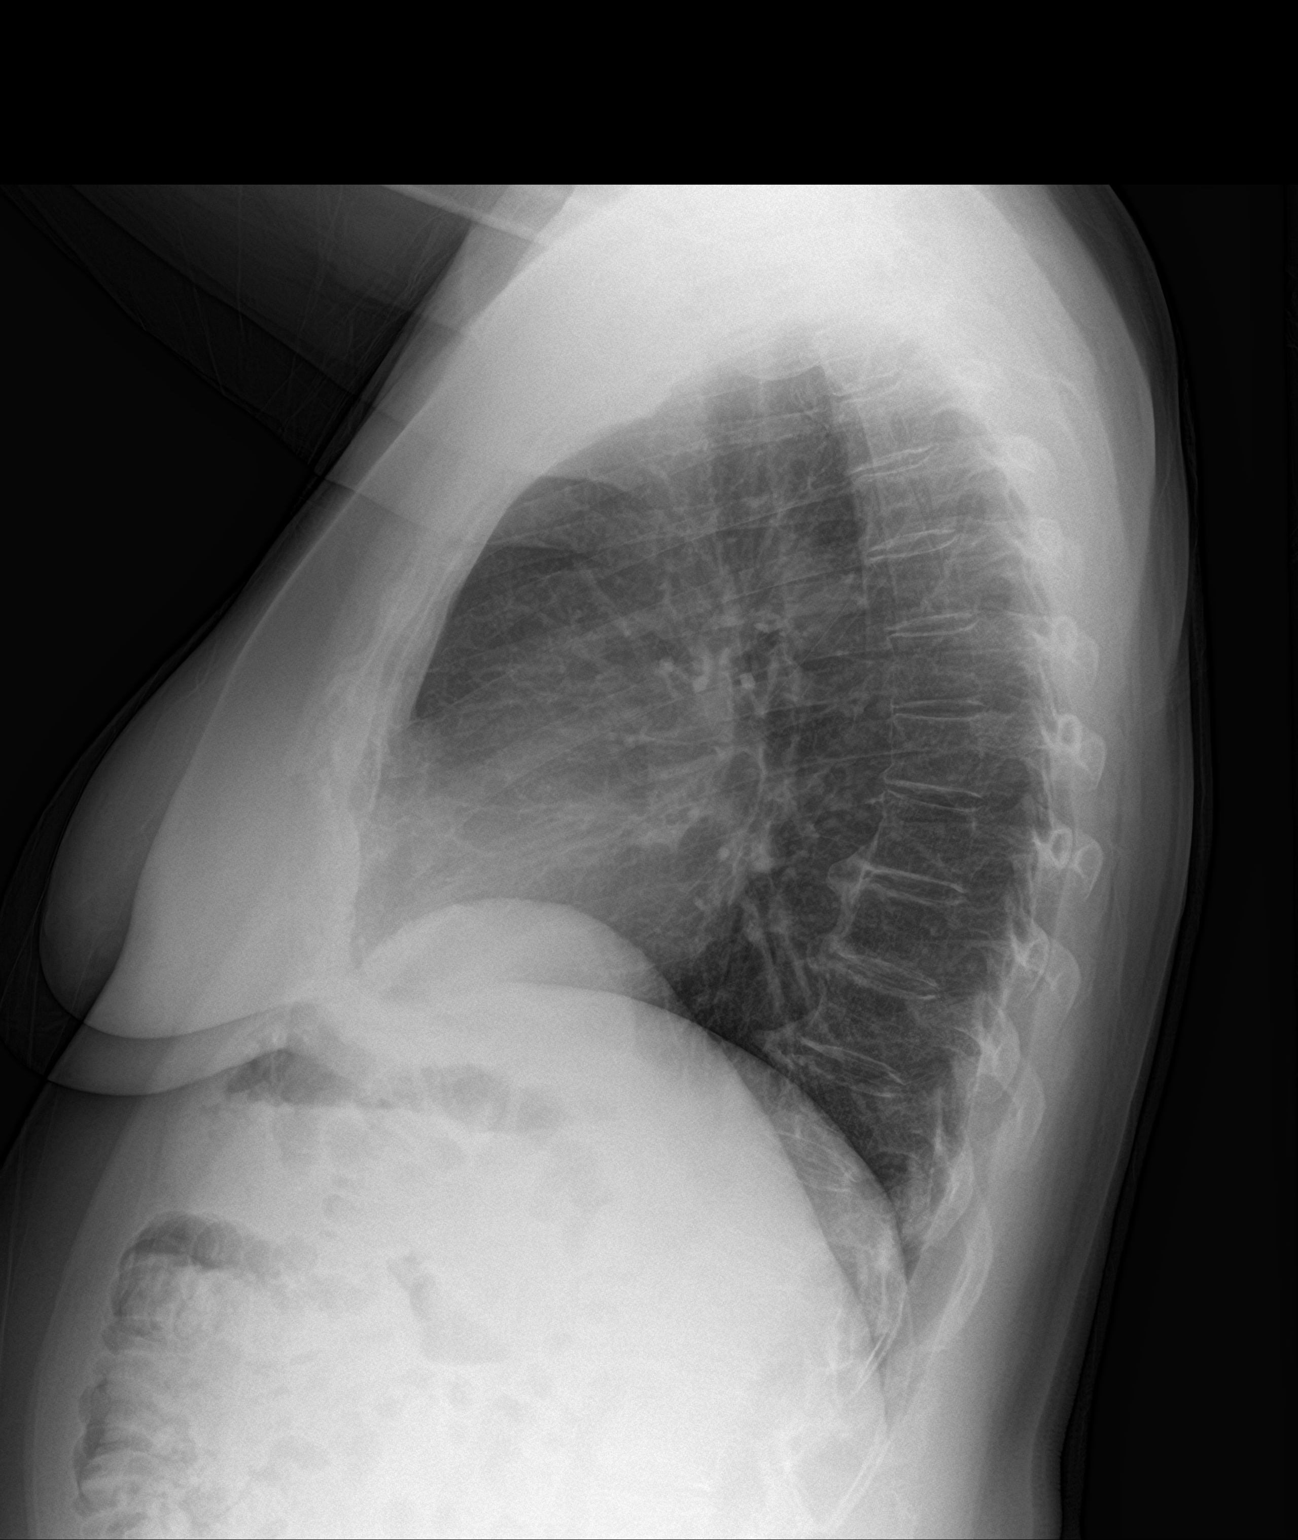

[2 of 2 positions shown; findings below may reference images not displayed]

FINDINGS: The heart size and mediastinal contours are within normal limits.
Both lungs are clear. The visualized skeletal structures are
unremarkable.
IMPRESSION: No active cardiopulmonary disease.

## 2024-01-28 ENCOUNTER — Telehealth: Payer: Self-pay

## 2024-01-28 NOTE — Telephone Encounter (Signed)
 Pt requesting call back to schedule colonoscopy.

## 2024-01-29 NOTE — Telephone Encounter (Signed)
 okay

## 2024-02-04 ENCOUNTER — Other Ambulatory Visit: Payer: Self-pay

## 2024-02-04 DIAGNOSIS — D490 Neoplasm of unspecified behavior of digestive system: Secondary | ICD-10-CM | POA: Insufficient documentation

## 2024-02-04 DIAGNOSIS — Z9889 Other specified postprocedural states: Secondary | ICD-10-CM | POA: Insufficient documentation

## 2024-02-04 DIAGNOSIS — Z9049 Acquired absence of other specified parts of digestive tract: Secondary | ICD-10-CM | POA: Insufficient documentation

## 2024-02-04 DIAGNOSIS — Z90710 Acquired absence of both cervix and uterus: Secondary | ICD-10-CM

## 2024-02-04 DIAGNOSIS — E785 Hyperlipidemia, unspecified: Secondary | ICD-10-CM

## 2024-02-04 HISTORY — DX: Hyperlipidemia, unspecified: E78.5

## 2024-02-04 HISTORY — DX: Acquired absence of other specified parts of digestive tract: Z90.49

## 2024-02-04 HISTORY — DX: Neoplasm of unspecified behavior of digestive system: D49.0

## 2024-02-04 HISTORY — DX: Other specified postprocedural states: Z98.890

## 2024-02-04 HISTORY — DX: Acquired absence of both cervix and uterus: Z90.710

## 2024-02-05 ENCOUNTER — Ambulatory Visit: Admitting: Gastroenterology

## 2024-02-05 ENCOUNTER — Encounter: Payer: Self-pay | Admitting: Gastroenterology

## 2024-02-05 VITALS — BP 135/70 | HR 73 | Temp 98.2°F | Ht 60.0 in | Wt 145.8 lb

## 2024-02-05 DIAGNOSIS — R143 Flatulence: Secondary | ICD-10-CM

## 2024-02-05 DIAGNOSIS — Z8719 Personal history of other diseases of the digestive system: Secondary | ICD-10-CM | POA: Diagnosis not present

## 2024-02-05 DIAGNOSIS — Z1211 Encounter for screening for malignant neoplasm of colon: Secondary | ICD-10-CM

## 2024-02-05 DIAGNOSIS — R140 Abdominal distension (gaseous): Secondary | ICD-10-CM

## 2024-02-05 NOTE — Patient Instructions (Signed)
Low-FODMAP Eating Plan  FODMAP stands for fermentable oligosaccharides, disaccharides, monosaccharides, and polyols. These are sugars that are hard for some people to digest. A low-FODMAP eating plan may help some people who have irritable bowel syndrome (IBS) and certain other bowel (intestinal) diseases to manage their symptoms. This meal plan can be complicated to follow. Work with a diet and nutrition specialist (dietitian) to make a low-FODMAP eating plan that is right for you. A dietitian can help make sure that you get enough nutrition from this diet. What are tips for following this plan? Reading food labels Check labels for hidden FODMAPs such as: High-fructose syrup. Honey. Agave. Natural fruit flavors. Onion or garlic powder. Choose low-FODMAP foods that contain 3-4 grams of fiber per serving. Check food labels for serving sizes. Eat only one serving at a time to make sure FODMAP levels stay low. Shopping Shop with a list of foods that are recommended on this diet and make a meal plan. Meal planning Follow a low-FODMAP eating plan for up to 6 weeks, or as told by your health care provider or dietitian. To follow the eating plan: Eliminate high-FODMAP foods from your diet completely. Choose only low-FODMAP foods to eat. You will do this for 2-6 weeks. Gradually reintroduce high-FODMAP foods into your diet one at a time. Most people should wait a few days before introducing the next new high-FODMAP food into their meal plan. Your dietitian can recommend how quickly you may reintroduce foods. Keep a daily record of what and how much you eat and drink. Make note of any symptoms that you have after eating. Review your daily record with a dietitian regularly to identify which foods you can eat and which foods you should avoid. General tips Drink enough fluid each day to keep your urine pale yellow. Avoid processed foods. These often have added sugar and may be high in FODMAPs. Avoid  most dairy products, whole grains, and sweeteners. Work with a dietitian to make sure you get enough fiber in your diet. Avoid high FODMAP foods at meals to manage symptoms. Recommended foods Fruits Bananas, oranges, tangerines, lemons, limes, blueberries, raspberries, strawberries, grapes, cantaloupe, honeydew melon, kiwi, papaya, passion fruit, and pineapple. Limited amounts of dried cranberries, banana chips, and shredded coconut. Vegetables Eggplant, zucchini, cucumber, peppers, green beans, bean sprouts, lettuce, arugula, kale, Swiss chard, spinach, collard greens, bok choy, summer squash, potato, and tomato. Limited amounts of corn, carrot, and sweet potato. Green parts of scallions. Grains Gluten-free grains, such as rice, oats, buckwheat, quinoa, corn, polenta, and millet. Gluten-free pasta, bread, or cereal. Rice noodles. Corn tortillas. Meats and other proteins Unseasoned beef, pork, poultry, or fish. Eggs. Bacon. Tofu (firm) and tempeh. Limited amounts of nuts and seeds, such as almonds, walnuts, brazil nuts, pecans, peanuts, nut butters, pumpkin seeds, chia seeds, and sunflower seeds. Dairy Lactose-free milk, yogurt, and kefir. Lactose-free cottage cheese and ice cream. Non-dairy milks, such as almond, coconut, hemp, and rice milk. Non-dairy yogurt. Limited amounts of goat cheese, brie, mozzarella, parmesan, swiss, and other hard cheeses. Fats and oils Butter-free spreads. Vegetable oils, such as olive, canola, and sunflower oil. Seasoning and other foods Artificial sweeteners with names that do not end in "ol," such as aspartame, saccharine, and stevia. Maple syrup, white table sugar, raw sugar, brown sugar, and molasses. Mayonnaise, soy sauce, and tamari. Fresh basil, coriander, parsley, rosemary, and thyme. Beverages Water and mineral water. Sugar-sweetened soft drinks. Small amounts of orange juice or cranberry juice. Black and green tea. Most dry wines.   Coffee. The items listed  above may not be a complete list of foods and beverages you can eat. Contact a dietitian for more information. Foods to avoid Fruits Fresh, dried, and juiced forms of apple, pear, watermelon, peach, plum, cherries, apricots, blackberries, boysenberries, figs, nectarines, and mango. Avocado. Vegetables Chicory root, artichoke, asparagus, cabbage, snow peas, Brussels sprouts, broccoli, sugar snap peas, mushrooms, celery, and cauliflower. Onions, garlic, leeks, and the white part of scallions. Grains Wheat, including kamut, durum, and semolina. Barley and bulgur. Couscous. Wheat-based cereals. Wheat noodles, bread, crackers, and pastries. Meats and other proteins Fried or fatty meat. Sausage. Cashews and pistachios. Soybeans, baked beans, black beans, chickpeas, kidney beans, fava beans, navy beans, lentils, black-eyed peas, and split peas. Dairy Milk, yogurt, ice cream, and soft cheese. Cream and sour cream. Milk-based sauces. Custard. Buttermilk. Soy milk. Seasoning and other foods Any sugar-free gum or candy. Foods that contain artificial sweeteners such as sorbitol, mannitol, isomalt, or xylitol. Foods that contain honey, high-fructose corn syrup, or agave. Bouillon, vegetable stock, beef stock, and chicken stock. Garlic and onion powder. Condiments made with onion, such as hummus, chutney, pickles, relish, salad dressing, and salsa. Tomato paste. Beverages Chicory-based drinks. Coffee substitutes. Chamomile tea. Fennel tea. Sweet or fortified wines such as port or sherry. Diet soft drinks made with isomalt, mannitol, maltitol, sorbitol, or xylitol. Apple, pear, and mango juice. Juices with high-fructose corn syrup. The items listed above may not be a complete list of foods and beverages you should avoid. Contact a dietitian for more information. Summary FODMAP stands for fermentable oligosaccharides, disaccharides, monosaccharides, and polyols. These are sugars that are hard for some people to  digest. A low-FODMAP eating plan is a short-term diet that helps to ease symptoms of certain bowel diseases. The eating plan usually lasts up to 6 weeks. After that, high-FODMAP foods are reintroduced gradually and one at a time. This can help you find out which foods may be causing symptoms. A low-FODMAP eating plan can be complicated. It is best to work with a dietitian who has experience with this type of plan. This information is not intended to replace advice given to you by your health care provider. Make sure you discuss any questions you have with your health care provider. Document Revised: 03/09/2020 Document Reviewed: 03/09/2020 Elsevier Patient Education  2024 Elsevier Inc.  

## 2024-02-05 NOTE — Progress Notes (Signed)
 Grace Mood MD, MRCP(U.K) [address]  [address]  [place], [place] [address]  Main: [phone]  Fax: [phone]   Gastroenterology Consultation  Referring Provider:     Brett Albino* Primary Care Physician:  [name], MD Primary Gastroenterologist:  Dr. Wyline Wheeler  Reason for Consultation:   Screening colonoscopy        HPI:   Grace Wheeler is a 78 y.o. y/o female referred for consultation & management  by Dr. [name], MD.     The patient requested this appointment to discuss about a screening colonoscopy in view of her comorbidities.  She has multiple medical issues in including Sjogren syndrome osteoarthritis review of epic suggest her last colonoscopy was back in 2011 at [facility].  She says that she had surgery for diverticulitis back in 2022 in [place] because she had complicated diverticulitis with an abscess had 15 cm of her colon possibly sigmoid colon resected.  She had a colonoscopy for that she found to have benign polyps.  She is not sure if she is supposed to come back in 3 years or 5 years for repeat colonoscopy she has her records at home and she will look into it and let us know if she is due for colonoscopy or not.  Her other complaint is she has had some gas and bloating of late she consumes caffeine free Pepsi.  She does have a bowel movement every other day and sometimes if she is constipated notices that the bloating and gas is more significant.  Denies any use of artificial sugars or sweeteners denies any use of chewing gum denies any use of Stevia or sweeteners.  No other GI symptoms no rectal bleeding no unintentional weight loss  Past Medical History:  Diagnosis Date   Arthritis    Autoimmune disease (HCC)    multiple autoimmune system problems   Basal cell carcinoma 10/23/2022   Left upper arm. Nodular, infiltrative type. 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   Osteoarthritis of carpometacarpal (CMC) joint of thumb 11/26/2023   Osteoarthritis of right knee 06/04/2023   Pain in joint of left knee 05/14/2023   Pain of right thumb 11/26/2023   Postoperative nausea 08/07/2023   Raynaud's disease    Sjogren syndrome, unspecified (HCC) 06/26/2017   Sjogren's disease (HCC)    Swelling of joint of left wrist 11/26/2023   Tear of meniscus of knee 05/16/2023   TMJ (dislocation of temporomandibular joint)     Past Surgical History:  Procedure Laterality Date   ABDOMINAL HYSTERECTOMY     BLADDER SURGERY     BURCH PROCEDURE     CARPAL TUNNEL RELEASE Bilateral     Prior to Admission medications   Medication Sig Start Date End Date Taking? Authorizing Provider  acetaminophen (TYLENOL) 650 MG CR tablet Tylenol Arthritis Pain 650 mg tablet,extended release  Take 1 tablet twice a day by oral route. 06/15/20  [provider]  clonazePAM (KLONOPIN) 1 MG tablet TAKE 1 TABLET BY MOUTH AT  BEDTIME 11/10/23   Simmons-Robinson, Makiera, MD  cycloSPORINE (RESTASIS) 0.05 % ophthalmic emulsion     [provider]  diclofenac Sodium (VOLTAREN) 1 % GEL diclofenac 1 % topical gel  APPLY 2 GRAM TO THE AFFECTED AREA(S) BY TOPICAL ROUTE 4 TIMES PER DAY 10/07/17   [provider]  gabapentin (NEURONTIN) 100 MG capsule Take 3 capsules (300 mg total) by mouth 2 (two) times daily. 11/19/23 11/18/24  Simmons-Robinson,  Tawanna Cooler, MD  hydrochlorothiazide (HYDRODIURIL) 25 MG tablet TAKE 1 TABLET BY MOUTH DAILY 12/03/23   Simmons-Robinson, Tawanna Cooler, MD  hydroxychloroquine (PLAQUENIL) 200 MG tablet hydroxychloroquine 200 mg tablet  TAKE 1 TABLET BY MOUTH  DAILY    [provider]  ketoconazole (NIZORAL) 2 % cream Apply 1 Application topically 2 (two) times daily. Apply 1-2 times daily to aa, nose, eyebrows. 01/07/24 02/18/24  Willeen Niece, MD  Light Mineral Oil-Mineral Oil 0.5-0.5 % EMUL Retaine MGD (PF) 0.5 %-0.5 % eye drops in a dropperette  As Needed    [provider]  metoprolol succinate (TOPROL-XL) 50 MG 24 hr tablet metoprolol succinate ER 50 mg tablet,extended release 24 hr  TAKE 1 TABLET BY MOUTH AT  BEDTIME 06/29/20   [provider]  metroNIDAZOLE (METROCREAM) 0.75 % cream Apply topically 2 (two) times daily. Apply to aa face 1-2 times per day 01/07/24 01/06/25  Willeen Niece, MD  rosuvastatin (CRESTOR) 10 MG tablet Take 1 tablet (10 mg total) by mouth daily. 12/25/23   Simmons-Robinson, Tawanna Cooler, MD    Family History  Problem Relation Age of Onset   Breast cancer Mother    Cancer Mother    Heart failure Father    Cancer Maternal Grandmother    Cancer Maternal Grandfather    Heart failure Brother    Heart failure Brother    Heart failure Brother    Heart failure Brother      Social History   Tobacco Use   Smoking status: Never   Smokeless tobacco: Never  Substance Use Topics   Alcohol use: Never   Drug use: Never    Allergies as of 02/05/2024 - Review Complete 01/21/2024  Allergen Reaction Noted   Zolpidem Other (See Comments) 03/27/2022   Baclofen Other (See Comments) 08/06/2022   Levofloxacin Nausea And Vomiting 03/27/2022   Orphenadrine Other (See Comments) 08/06/2022   Pregabalin Other (See Comments) 08/06/2022   Tizanidine Other (See Comments) 08/06/2022   Trazodone Other (See Comments) 07/02/2022   Trazodone and nefazodone  07/02/2022   Celecoxib Diarrhea  and Other (See Comments) 07/31/2023    Review of Systems:    All systems reviewed and negative except where noted in HPI.   Physical Exam:  There were no vitals taken for this visit. No LMP recorded. Patient is postmenopausal. Psych:  Alert and cooperative. Normal Wheeler and affect. General:   Alert,  Well-developed, well-nourished, pleasant and cooperative in NAD Head:  Normocephalic and atraumatic. Eyes:  Sclera clear, no icterus.   Conjunctiva pink. Ears:  Normal auditory acuity. Neurologic:  Alert and oriented x3;  grossly normal neurologically. Psych:  Alert and cooperative. Normal Wheeler and affect.  Imaging Studies: MM 3D SCREENING MAMMOGRAM BILATERAL BREAST Result Date: 01/16/2024 CLINICAL DATA:  Screening. EXAM: DIGITAL SCREENING BILATERAL MAMMOGRAM WITH TOMOSYNTHESIS AND CAD TECHNIQUE: Bilateral screening digital craniocaudal and mediolateral oblique mammograms were obtained. Bilateral screening digital breast tomosynthesis was performed. The images were evaluated with computer-aided detection.  COMPARISON:  Previous exam(s). ACR Breast Density Category b: There are scattered areas of fibroglandular density. FINDINGS: There are no findings suspicious for malignancy. IMPRESSION: No mammographic evidence of malignancy. A result letter of this screening mammogram will be mailed directly to the patient. RECOMMENDATION: Screening mammogram in one year. (Code:SM-B-01Y) BI-RADS CATEGORY  1: Negative. Electronically Signed   By: Baird Lyons M.D.   On: 01/16/2024 12:49   DG Bone Density Result Date: 01/13/2024 EXAM: DUAL X-RAY ABSORPTIOMETRY (DXA) FOR BONE MINERAL DENSITY IMPRESSION: Dear Dr Neita Garnet, Your patient Jecenia Leamer completed a FRAX assessment on 01/13/2024 using the Lunar iDXA DXA System (analysis version: 14.10) manufactured by [employer]. The following summarizes the results of our evaluation. PATIENT BIOGRAPHICAL: Name: Xariah, Silvernail Patient ID: [id number] Birth Date:  December 21, 1945 Height:    60.0 in. Gender:     Female    [age]       Weight:    145.0 lbs. Ethnicity:  White                            Exam Date: 01/13/2024 FRAX* RESULTS:  (version: 3.5) 10-year Probability of Fracture1 Major Osteoporotic Fracture2 Hip Fracture 12.3% 2.8% Population: [place] (Caucasian) Risk Factors: None Based on Femur (Left) Neck BMD 1 -The 10-year probability of fracture may be lower than reported if the patient has received treatment. 2 -Major Osteoporotic Fracture: Clinical Spine, Forearm, Hip or Shoulder *FRAX is a Armed forces logistics/support/administrative officer of the [employer] of [employer] for Metabolic Bone Disease, a World Science writer (WHO) [employer]. ASSESSMENT: The probability of a major osteoporotic fracture is 12.3% within the next ten years. The probability of a hip fracture is 2.8% within the next ten years. . Your patient Chayse Gracey completed a BMD test on 01/13/2024 using the [employer] iDXA DXA System (software version: 14.10) manufactured by [employer]. The following summarizes the results of our evaluation. Technologist: MTB PATIENT BIOGRAPHICAL: Name: Graclynn, Vanantwerp Patient ID: [id number] Birth Date: [birth year] Height: 60.0 in. Gender: Female Exam Date: 01/13/2024 Weight: 145.0 lbs. Indications: Caucasian, Hysterectomy, Oophorectomy Bilateral, Postmenopausal Fractures: Treatments: DENSITOMETRY RESULTS: Site         Region     Measured Date Measured Age WHO Classification Young Adult T-score BMD         %Change vs. Previous Significant Change (*) DualFemur Neck Left 01/13/2024 75.8 Osteopenia -1.7 0.800 g/cm2 - - DualFemur Total Mean 01/13/2024 75.8 Normal -1.0 0.882 g/cm2 - - Left Forearm Radius 33% 01/13/2024 75.8 Osteopenia -1.6 0.739 g/cm2 - - ASSESSMENT: The BMD measured at Femur Neck Left is 0.800 g/cm2 with a T-score of -1.7. This patient's diagnostic category is LOW BONE MASS/OSTEOPENIA according to World Health Organization [facility]) criteria. The scan  quality is good. Lumbar spine was not utilized due to advanced degenerative changes. World Science writer [facility]) criteria for post-menopausal, Caucasian Women: Normal:                   T-score at or above -1 SD Osteopenia/low bone mass: T-score between -1 and -2.5 SD Osteoporosis:             T-score at or below -2.5 SD RECOMMENDATIONS: 1. All patients should optimize calcium and vitamin D intake. 2. Consider FDA-approved medical therapies in postmenopausal women and men aged 2 years and older, based on the following: a. A hip or vertebral(clinical or morphometric) fracture b. T-score < -2.5  at the femoral neck or spine after appropriate evaluation to exclude secondary causes c. Low bone mass (T-score between -1.0 and -2.5 at the femoral neck or spine) and a 10-year probability of a hip fracture > 3% or a 10-year probability of a major osteoporosis-related fracture > 20% based on the [place]-adapted WHO algorithm 3. Clinician judgment and/or patient preferences may indicate treatment for people with 10-year fracture probabilities above or below these levels FOLLOW-UP: People with diagnosed cases of osteoporosis or at high risk for fracture should have regular bone mineral density tests. For patients eligible for Medicare, routine testing is allowed once every 2 years. The testing frequency can be increased to one year for patients who have rapidly progressing disease, those who are receiving or discontinuing medical therapy to restore bone mass, or have additional risk factors. I have reviewed this report, and agree with the above findings. [facility] Electronically Signed   By: Harmon Pier M.D.   On: 01/13/2024 14:13   DG Ankle Complete Left Result Date: 01/07/2024 Please see detailed radiograph report in office note.  DG Ankle Complete Right Result Date: 01/07/2024 Please see detailed radiograph report in office note.   Assessment and Plan:   Sharetta Ricchio is a 78 y.o. y/o female has been  referred for colon cancer screening.  She had a colonoscopy in 2022 prior to surgery for complicated diverticulitis requiring surgery and part of her colon resected.  She is unsure if she needs her colonoscopy 3 years after 5 years after.  She did say that she has her records at home she will go home and look at it and call our office if she is due for her colonoscopy.  In addition she has some gas and bloating which I believe is due to an element of constipation as well as use of artificial sugars in her drinks.  She will perform a trial where she uses MiraLAX every day to have a soft bowel movement and elevated all artificial sugars and sweeteners.  If it does not get better she has been advised to come back and see [place].  She has been provided with information for a low FODMAP diet    Follow up in as needed  Dr Grace Mood MD,MRCP(U.K)

## 2024-02-06 ENCOUNTER — Other Ambulatory Visit: Payer: Self-pay | Admitting: Family Medicine

## 2024-02-06 DIAGNOSIS — G4700 Insomnia, unspecified: Secondary | ICD-10-CM

## 2024-02-09 ENCOUNTER — Telehealth: Payer: Self-pay | Admitting: Gastroenterology

## 2024-02-09 MED ORDER — CLONAZEPAM 1 MG PO TABS: 1.0000 mg | ORAL_TABLET | Freq: Every day | ORAL | 0 refills | Status: DC

## 2024-02-09 NOTE — Telephone Encounter (Signed)
 Patient called to inform us that she had spoken with her provider in [place] regarding the timing of her next colonoscopy. She was advised that her next colonoscopy is due in December. The patient inquired if it was too early to schedule the procedure at this time. I advised her to call back around August or September to schedule for December.

## 2024-03-16 ENCOUNTER — Telehealth: Payer: Self-pay

## 2024-03-16 NOTE — Telephone Encounter (Signed)
 Patient called about prescriptions from last office visit. She states she has had no improvement with the Metronidazole , Hydrocortisone or Ketoconazole  creams. Patient is asking do you know of any alternatives she can try?

## 2024-03-17 ENCOUNTER — Encounter: Payer: Self-pay | Admitting: Dermatology

## 2024-03-17 MED ORDER — PIMECROLIMUS 1 % EX CREA: TOPICAL_CREAM | Freq: Two times a day (BID) | CUTANEOUS | 0 refills | Status: DC

## 2024-03-17 MED ORDER — DOXYCYCLINE HYCLATE 20 MG PO TABS: 20.0000 mg | ORAL_TABLET | Freq: Two times a day (BID) | ORAL | 2 refills | Status: DC

## 2024-03-17 NOTE — Telephone Encounter (Signed)
 Patient advised and prescriptions sent in. aw

## 2024-04-09 ENCOUNTER — Other Ambulatory Visit: Payer: Self-pay | Admitting: Dermatology

## 2024-04-12 ENCOUNTER — Encounter: Payer: Self-pay | Admitting: Podiatry

## 2024-04-12 ENCOUNTER — Ambulatory Visit: Admitting: Podiatry

## 2024-04-12 DIAGNOSIS — M19071 Primary osteoarthritis, right ankle and foot: Secondary | ICD-10-CM

## 2024-04-12 DIAGNOSIS — M19072 Primary osteoarthritis, left ankle and foot: Secondary | ICD-10-CM | POA: Diagnosis not present

## 2024-04-13 ENCOUNTER — Encounter: Payer: Self-pay | Admitting: Podiatry

## 2024-04-13 NOTE — Progress Notes (Signed)
  Subjective:  Patient ID: Edwin Cherian, female    DOB: 1946-01-16,  MRN: [id number]   Chief Complaint  Patient presents with   Arthritis    "The injection did not help.  They're not any better.  They stay continued swollen most of the time.  I want to see if it's possible to get nerve block injections in them or if he has any other suggestions.  I don't want to do the subtalar surgery."      History of Present Illness   Yilin Weedon is a 78 year old female with arthritis and fibromyalgia who returns for follow-up.  The subtalar injection did not help         Objective:    Physical Exam   EXTREMITIES: Feet warm and level, palpable pulses, good capillary refill time, mild varicose veins present. MUSCULOSKELETAL: Pain and stiffness in bilateral subtalar joint, good range of motion in ankle joint, no tenderness in ankle joint or anterior joint line.       No images are attached to the encounter.    Results   RADIOLOGY Left foot and ankle radiograph: Mild degenerative changes in the medial gutter of the ankle joint and subtalar joint spacing area with subchondral sclerosis and periarticular spurring (01/07/2024)      Assessment:   Encounter Diagnosis  Name Primary?   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  We discussed further treatment options.  Nonsurgically we discussed further injection and/or bracing with an [place]  mezzo brace.  Surgical we discussed subtalar joint arthrodesis which I think long-term for her would provide a good result but she is hesitant to proceed with surgery due to previous issues with her knee surgery.  She will consider her options and let me know which way she would like to proceed.  If she would like to proceed with  the bracing I will refer her to [facility] clinic and send prescription for.

## 2024-04-26 ENCOUNTER — Other Ambulatory Visit: Payer: Self-pay | Admitting: Family Medicine

## 2024-04-26 DIAGNOSIS — G4700 Insomnia, unspecified: Secondary | ICD-10-CM

## 2024-05-25 ENCOUNTER — Ambulatory Visit: Payer: Medicare Other | Admitting: Dermatology

## 2024-05-25 ENCOUNTER — Encounter: Payer: Self-pay | Admitting: Dermatology

## 2024-05-25 DIAGNOSIS — L820 Inflamed seborrheic keratosis: Secondary | ICD-10-CM | POA: Diagnosis not present

## 2024-05-25 DIAGNOSIS — D225 Melanocytic nevi of trunk: Secondary | ICD-10-CM

## 2024-05-25 DIAGNOSIS — D229 Melanocytic nevi, unspecified: Secondary | ICD-10-CM

## 2024-05-25 DIAGNOSIS — L578 Other skin changes due to chronic exposure to nonionizing radiation: Secondary | ICD-10-CM

## 2024-05-25 DIAGNOSIS — Z1283 Encounter for screening for malignant neoplasm of skin: Secondary | ICD-10-CM

## 2024-05-25 DIAGNOSIS — L219 Seborrheic dermatitis, unspecified: Secondary | ICD-10-CM

## 2024-05-25 DIAGNOSIS — W908XXA Exposure to other nonionizing radiation, initial encounter: Secondary | ICD-10-CM

## 2024-05-25 DIAGNOSIS — L821 Other seborrheic keratosis: Secondary | ICD-10-CM

## 2024-05-25 DIAGNOSIS — L719 Rosacea, unspecified: Secondary | ICD-10-CM

## 2024-05-25 DIAGNOSIS — L720 Epidermal cyst: Secondary | ICD-10-CM

## 2024-05-25 DIAGNOSIS — L814 Other melanin hyperpigmentation: Secondary | ICD-10-CM

## 2024-05-25 DIAGNOSIS — L570 Actinic keratosis: Secondary | ICD-10-CM | POA: Diagnosis not present

## 2024-05-25 DIAGNOSIS — D1801 Hemangioma of skin and subcutaneous tissue: Secondary | ICD-10-CM

## 2024-05-25 DIAGNOSIS — Z85828 Personal history of other malignant neoplasm of skin: Secondary | ICD-10-CM

## 2024-05-25 NOTE — Progress Notes (Addendum)
 Follow-Up Visit   Subjective  Grace Wheeler is a 78 y.o. female who presents for the following: Skin Cancer Screening and Full Body Skin Exam. HxBCC, seb derm, and rosacea. Pink place at R cheek and persitent bumps on forehead she picks at, R forearm spot present x couple weeks. Patient states she does have some moles on her back that are getting irritated and itchy.   For rosacea, took Doxycycline  20 mg BID no side effects, metronidazole  couldn't tell much difference when using topical nor Doxycycline .   For seb derm, pimecrolimus  was very expensive and didn't really help. Uses ketoconazole  cream as well.   The patient presents for Total-Body Skin Exam (TBSE) for skin cancer screening and mole check. The patient has spots, moles and lesions to be evaluated, some may be new or changing and the patient may have concern these could be cancer.   The following portions of the chart were reviewed this encounter and updated as appropriate: medications, allergies, medical history  Review of Systems:  No other skin or systemic complaints except as noted in HPI or Assessment and Plan.  Objective  Well appearing patient in no apparent distress; mood and affect are within normal limits.  A full examination was performed including scalp, head, eyes, ears, nose, lips, neck, chest, axillae, abdomen, back, buttocks, bilateral upper extremities, bilateral lower extremities, hands, feet, fingers, toes, fingernails, and toenails. All findings within normal limits unless otherwise noted below.   Relevant physical exam findings are noted in the Assessment and Plan.  L medial cheek x1, mid back x7, R flank x3, L chest x1, Bilateral upper thighs x2, R frontal hairline x2, L forehead x4, L pretibia x1 (21) Stuck on waxy paps with erythema  4 mm pink, slightly firm papule at L medial cheek, Benign features under dermoscopy. photo taken today prior to cryotherapy.   Left upper arm x1 Pink macule at edge of  scar at left upper arm  Assessment & Plan   SKIN CANCER SCREENING PERFORMED TODAY.  ACTINIC DAMAGE - Chronic condition, secondary to cumulative UV/sun exposure - diffuse scaly erythematous macules with underlying dyspigmentation - Recommend daily broad spectrum sunscreen SPF 30+ to sun-exposed areas, reapply every 2 hours as needed.  - Staying in the shade or wearing long sleeves, sun glasses (UVA+UVB protection) and wide brim hats (4-inch brim around the entire circumference of the hat) are also recommended for sun protection.  - Call for new or changing lesions.  LENTIGINES, SEBORRHEIC KERATOSES, HEMANGIOMAS - Benign normal skin lesions - Benign-appearing - Call for any changes  SEBORRHEIC KERATOSIS - Stuck-on, waxy, tan-brown papules and/or plaques at forehead, R upper arm, R forearm, legs.  - Benign-appearing - Discussed benign etiology and prognosis. - Observe - Call for any changes  MELANOCYTIC NEVI - Tan-brown and/or pink-flesh-colored symmetric macules and papules - 4 mm brown macule at right mid lower back  - 6 mm speckled tan macule at left lower back  - Regular brown macules at buttocks - Benign appearing on exam today - Observation - Call clinic for new or changing moles - Recommend daily use of broad spectrum spf 30+ sunscreen to sun-exposed areas.   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 Condition is symptomatic/ bothersome to patient. Not currently at goal.   Rosacea is a chronic progressive skin condition usually affecting the  face of adults, causing redness and/or acne bumps. It is treatable but not curable. It sometimes affects the eyes (ocular rosacea) as well. It may respond to topical and/or systemic medication and can flare with stress, sun exposure, alcohol, exercise, topical steroids (including hydrocortisone/cortisone 10) and some foods.  Daily application of broad spectrum spf 30+ sunscreen to face is recommended to reduce flares.  Patient c/o grittiness of the eyes. She has eye problems that require drops multiple times daily due to excessive dryness and feeling of grit which she has attributed to Sjogrens in the past  Treatment Plan Continue using metronidazole  0.75% cream apply to aa face 1-2 times per day to face for rosacea.  May take doxycyline 20 mg po BID when flaring if topical metronidazole  not helping. Would consider increasing to regular daily use to help decrease grittiness in eyes (if rosacea related)  Doxycycline  should be taken with food to prevent nausea. Do not lay down for 30 minutes after taking. Be cautious with sun exposure and use good sun protection while on this medication. Pregnant women should not take this medication.   For dry, irritated eyes, recommend OTC artificial tears lubricating eye drops, such as Natural Tears or Systane, and apply as needed during the day and before bed.  If that is not effective, can add OTC ketotifen eye drops (Zaditor) twice daily.   May also need to address eye symptoms with ophthalmologist if not improving.   SEBORRHEIC DERMATITIS Exam: Pink scaliness at R alar crease, eyebrows.  Chronic and persistent condition with duration or expected duration over one year. Condition is symptomatic/ bothersome to patient. Not currently at goal.   Seborrheic Dermatitis is a chronic persistent rash characterized by pinkness and scaling most commonly of the mid face but also can occur on the scalp (dandruff), ears; mid chest, mid back and groin.  It tends to be  exacerbated by stress and cooler weather.  People who have neurologic disease may experience new onset or exacerbation of existing seborrheic dermatitis.  The condition is not curable but treatable and can be controlled.  Treatment Plan: Continue using ketoconazole  2% cream to aa, nose, eyebrows for dry itchy, flakey patches, 1-2 times per day for seb derm.   May use remaining pimecrolimus  every day/bid prn then do not have to fill again since too expensive.    Milia - tiny firm white papules at face - type of cyst - benign - sometimes these will clear with nightly OTC adapalene/Differin 0.1% gel or retinol. - may be extracted if symptomatic - observe  INFLAMED SEBORRHEIC KERATOSIS (21) L medial cheek x1, mid back x7, R flank x3, L chest x1, Bilateral upper thighs x2, R frontal hairline x2, L forehead x4, L pretibia x1 (21) Recheck L medial cheek at f/u- if not resolved, RTC for reevaluation  Symptomatic, irritating, patient would like treated. Destruction of lesion - L medial cheek x1, mid back x7, R flank x3, L chest x1, Bilateral upper thighs x2, R frontal hairline x2, L forehead x4, L pretibia x1 (21)  Destruction method: cryotherapy   Informed consent: discussed and consent obtained   Lesion destroyed using liquid nitrogen: Yes   Region frozen until ice ball extended beyond lesion:  Yes   Outcome: patient tolerated procedure well with no complications   Post-procedure details: wound care instructions given   Additional details:  Prior to procedure, discussed risks of blister formation, small wound, skin dyspigmentation, or rare scar following cryotherapy. Recommend Vaseline ointment to treated areas while healing.   AK (ACTINIC KERATOSIS) Left upper arm x1 Vs. Recurrent BCC, recheck at f/u    Actinic keratoses are precancerous spots that appear secondary to cumulative UV radiation exposure/sun exposure over time. They are chronic with expected duration over 1 year. A portion of  actinic keratoses will progress to squamous cell carcinoma of the skin. It is not possible to reliably predict which spots will progress to skin cancer and so treatment is recommended to prevent development of skin cancer.  Recommend daily broad spectrum sunscreen SPF 30+ to sun-exposed areas, reapply every 2 hours as needed.  Recommend staying in the shade or wearing long sleeves, sun glasses (UVA+UVB protection) and wide brim hats (4-inch brim around the entire circumference of the hat). Call for new or changing lesions. Destruction of lesion - Left upper arm x1  Destruction method: cryotherapy   Informed consent: discussed and consent obtained   Lesion destroyed using liquid nitrogen: Yes   Region frozen until ice ball extended beyond lesion: Yes   Outcome: patient tolerated procedure well with no complications   Post-procedure details: wound care instructions given   Additional details:  Prior to procedure, discussed risks of blister formation, small wound, skin dyspigmentation, or rare scar following cryotherapy. Recommend Vaseline ointment to treated areas while healing.   Return in about 1 year (around 05/25/2025) for TBSE, w/ Dr. Jackquline, Rosacea, Seb Derm, recheck L medial cheek , HxBCC.  I, Jacquelynn V. Wilfred, CMA, am acting as scribe for Rexene Jackquline, MD .   Documentation: I have reviewed the above documentation for accuracy and completeness, and I agree with the above.  Rexene Jackquline, MD

## 2024-05-25 NOTE — Patient Instructions (Addendum)
 For Seb Derm: Continue using ketoconazole  2% cream to aa, nose, eyebrows for dry itchy, flakey patches, 1-2 times per day for seb derm.   May use remaining pimecrolimus  then do not have to fill again since too expensive.   For Rosacea: Continue using metronidazole  0.75% cream apply to aa face 1-2 times per day to face for rosacea.  May take doxycyline 20 mg po BID when flaring if topical metronidazole  not helping.   Doxycycline  should be taken with food to prevent nausea. Do not lay down for 30 minutes after taking. Be cautious with sun exposure and use good sun protection while on this medication. Pregnant women should not take this medication.  For Milia on face: Recommend OTC Adapalene 0.1% (would be in the acne section at the drugstore) apply pea-sized amount nightly. La Roche Posay, Neutrogena are brands that make some. If too drying may decrease to using every other night.             Cryotherapy Aftercare  Wash gently with soap and water everyday.   Apply Vaseline and Band-Aid daily until healed.    Recommend daily broad spectrum sunscreen SPF 30+ to sun-exposed areas, reapply every 2 hours as needed. Call for new or changing lesions.  Staying in the shade or wearing long sleeves, sun glasses (UVA+UVB protection) and wide brim hats (4-inch brim around the entire circumference of the hat) are also recommended for sun protection.    Melanoma ABCDEs  Melanoma is the most dangerous type of skin cancer, and is the leading cause of death from skin disease.  You are more likely to develop melanoma if you: Have light-colored skin, light-colored eyes, or red or blond hair Spend a lot of time in the sun Tan regularly, either outdoors or in a tanning bed Have had blistering sunburns, especially during childhood Have a close family member who has had a melanoma Have atypical moles or large birthmarks  Early detection of melanoma is key since treatment is typically  straightforward and cure rates are extremely high if we catch it early.   The first sign of melanoma is often a change in a mole or a new dark spot.  The ABCDE system is a way of remembering the signs of melanoma.  A for asymmetry:  The two halves do not match. B for border:  The edges of the growth are irregular. C for color:  A mixture of colors are present instead of an even brown color. D for diameter:  Melanomas are usually (but not always) greater than 6mm - the size of a pencil eraser. E for evolution:  The spot keeps changing in size, shape, and color.  Please check your skin once per month between visits. You can use a small mirror in front and a large mirror behind you to keep an eye on the back side or your body.   If you see any new or changing lesions before your next follow-up, please call to schedule a visit.  Please continue daily skin protection including broad spectrum sunscreen SPF 30+ to sun-exposed areas, reapplying every 2 hours as needed when you're outdoors.   Staying in the shade or wearing long sleeves, sun glasses (UVA+UVB protection) and wide brim hats (4-inch brim around the entire circumference of the hat) are also recommended for sun protection.    Due to recent changes in healthcare laws, you may see results of your pathology and/or laboratory studies on MyChart before the doctors have had a chance to  review them. We understand that in some cases there may be results that are confusing or concerning to you. Please understand that not all results are received at the same time and often the doctors may need to interpret multiple results in order to provide you with the best plan of care or course of treatment. Therefore, we ask that you please give us  2 business days to thoroughly review all your results before contacting the office for clarification. Should we see a critical lab result, you will be contacted sooner.   If You Need Anything After Your Visit  If  you have any questions or concerns for your doctor, please call our main line at [phone] and press option 4 to reach your doctor's medical assistant. If no one answers, please leave a voicemail as directed and we will return your call as soon as possible. Messages left after 4 pm will be answered the following business day.   You may also send us  a message via MyChart. We typically respond to MyChart messages within 1-2 business days.  For prescription refills, please ask your pharmacy to contact our office. Our fax number is [phone].  If you have an urgent issue when the clinic is closed that cannot wait until the next business day, you can page your doctor at the number below.    Please note that while we do our best to be available for urgent issues outside of office hours, we are not available 24/7.   If you have an urgent issue and are unable to reach us , you may choose to seek medical care at your doctor's office, retail clinic, urgent care center, or emergency room.  If you have a medical emergency, please immediately call 911 or go to the emergency department.  Pager Numbers  - Dr. Hester: [phone]  - Dr. Jackquline: [phone]  - Dr. Claudene: [phone]   In the event of inclement weather, please call our main line at [phone] for an update on the status of any delays or closures.  Dermatology Medication Tips: Please keep the boxes that topical medications come in in order to help keep track of the instructions about where and how to use these. Pharmacies typically print the medication instructions only on the boxes and not directly on the medication tubes.   If your medication is too expensive, please contact our office at [phone] option 4 or send us  a message through MyChart.   We are unable to tell what your co-pay for medications will be in advance as this is different depending on your insurance coverage. However, we may be able to find a substitute  medication at lower cost or fill out paperwork to get insurance to cover a needed medication.   If a prior authorization is required to get your medication covered by your insurance company, please allow us  1-2 business days to complete this process.  Drug prices often vary depending on where the prescription is filled and some pharmacies may offer cheaper prices.  The website www.goodrx.com contains coupons for medications through different pharmacies. The prices here do not account for what the cost may be with help from insurance (it may be cheaper with your insurance), but the website can give you the price if you did not use any insurance.  - You can print the associated coupon and take it with your prescription to the pharmacy.  - You may also stop by our office during regular business hours and pick up a GoodRx coupon card.  -  If you need your prescription sent electronically to a different pharmacy, notify our office through [facility] or by phone at [phone] option 4.     Si Usted Necesita Algo Despus de Su Visita  Tambin puede enviarnos un mensaje a travs de Clinical cytogeneticist. Por lo general respondemos a los mensajes de MyChart en el transcurso de 1 a 2 das hbiles.  Para renovar recetas, por favor pida a su farmacia que se ponga en contacto con nuestra oficina. Randi lakes de fax es [place] [phone].  Si tiene un asunto urgente cuando la clnica est cerrada y que no puede esperar hasta el siguiente da hbil, puede llamar/localizar a su doctor(a) al nmero que aparece a continuacin.   Por favor, tenga en cuenta que aunque hacemos todo lo posible para estar disponibles para asuntos urgentes fuera del horario de [place], no estamos disponibles las 24 horas del da, los 7 [address]  [address] de la [place].   Si tiene un problema urgente y no puede comunicarse con nosotros, puede optar por buscar atencin mdica  en el consultorio de su doctor(a), en una clnica privada, en un centro de  atencin urgente o en una sala de emergencias.  Si tiene Engineer, drilling, por favor llame inmediatamente al 911 o vaya a la sala de emergencias.  Nmeros de bper  - Dr. Hester: [phone]  - Dra. Jackquline: [phone]  - Dr. Claudene: [phone]   En caso de inclemencias del tiempo, por favor llame a landry capes principal al [phone] para una actualizacin sobre el [place] de cualquier retraso o cierre.  Consejos para la medicacin en dermatologa: Por favor, guarde las cajas en las que vienen los medicamentos de uso tpico para ayudarle a seguir las instrucciones sobre dnde y cmo usarlos. Las farmacias generalmente imprimen las instrucciones del medicamento slo en las cajas y no directamente en los tubos del [place].   Si su medicamento es muy caro, por favor, pngase en contacto con landry rieger llamando al [phone] y presione la opcin 4 o envenos un mensaje a travs de Clinical cytogeneticist.   No podemos decirle cul ser su copago por los medicamentos por adelantado ya que esto es diferente dependiendo de la cobertura de su seguro. Sin embargo, es posible que podamos encontrar un medicamento sustituto a Audiological scientist un formulario para que el seguro cubra el medicamento que se considera necesario.   Si se requiere una autorizacin previa para que su compaa de seguros malta su medicamento, por favor permtanos de 1 a 2 das hbiles para completar este proceso.  Los precios de los medicamentos varan con frecuencia dependiendo del Environmental consultant de dnde se surte la receta y alguna farmacias pueden ofrecer precios ms baratos.  El sitio web www.goodrx.com tiene cupones para medicamentos de Health and safety inspector. Los precios aqu no tienen en cuenta lo que podra costar con la ayuda del seguro (puede ser ms barato con su seguro), pero el sitio web puede darle el precio si no utiliz Tourist information centre manager.  - Puede imprimir el cupn correspondiente y llevarlo con su receta a la  farmacia.  - Tambin puede pasar por nuestra oficina durante el horario de atencin regular y Education officer, museum una tarjeta de cupones de GoodRx.  - Si necesita que su receta se enve electrnicamente a una farmacia diferente, informe a nuestra oficina a travs de MyChart de [place] o por telfono llamando al [phone] y presione la opcin 4.

## 2024-06-13 NOTE — Progress Notes (Signed)
 Office Visit Note  Patient: Grace Wheeler             Date of Birth: November 09, 1945           MRN: [id number]             PCP: Sharma Coyer, MD Referring: Hilma Hastings, PA-C Visit Date: 06/14/2024   Subjective:  New Patient (Initial Visit) (Daily pain with no relief. Doesn't feel as if she was getting any relief from previous Dr. Concerns with her ankles and feet.)   Discussed the use of AI scribe software for clinical note transcription with the patient, who gave verbal consent to proceed.  History of Present Illness   Grace Wheeler is a 78 year old female with Sjogren's syndrome, osteoarthritis, and fibromyalgia who presents for management of her chronic pain and joint issues. She is accompanied by her husband, Grace Wheeler. She was previously seen by Dr. Tobie for rheumatology care.  She experiences significant dryness due to Sjogren's syndrome, affecting her eyes, mouth, hair, skin, and nails. She has been on hydroxychloroquine 200 mg daily for about 25 years without consistent noticeable improvement. She uses Retain eye drops during the day and ointment at night, having tried Restasis and Xiidra with less effectiveness or side effects. For dry mouth, she drinks water and uses Magic Mouthwash and Oxifresh for mouth sores.  A knee replacement nearly a year ago did not resolve her pain. She experiences significant pain and stiffness, especially after physical therapy. She has not had fluid removed from the knee but has had steroid injections in both knees and ankles without relief. 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  She experiences chronic pain from fibromyalgia, described as nerve-related with heightened sensitivity. Gabapentin  100 mg twice daily for about a year has not provided significant relief. She takes meloxicam  7.5 mg every other day due to gastrointestinal side effects and Tylenol  Arthritis 650 mg, two to four times daily, for pain management. She previously experienced  migraines with fibromyalgia but not currently.  She uses a cane for mobility, especially outside the home, due to pain and instability from her knee and ankle issues. She has a history of meniscus tears in both knees, exacerbated by everyday activities. 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  Her sleep is poor, often taking clonazepam  1 mg before bed, but she struggles with falling asleep due to racing thoughts and wakes frequently. She manages household tasks and finances but sometimes questions her concentration and memory.  She has a history of gout and neuropathy, contributing to her overall pain and discomfort. She has not had recent x-rays of her hands or back.       Activities of Daily Living:  Patient reports morning stiffness for 30-40 minutes.   Patient Reports nocturnal pain.  Difficulty dressing/grooming: Reports Difficulty climbing stairs: Reports Difficulty getting out of chair: Reports Difficulty using hands for taps, buttons, cutlery, and/or writing: Reports  Review of Systems  Constitutional:  Positive for fatigue.  HENT:  Positive for mouth sores and mouth dryness.   Eyes:  Positive for dryness.  Respiratory:  Positive for shortness of breath.   Cardiovascular:  Positive for palpitations. Negative for chest pain.  Gastrointestinal:  Negative for blood in stool, constipation and diarrhea.  Endocrine: Negative for increased urination.  Genitourinary:  Positive for involuntary urination.  Musculoskeletal:  Positive for joint pain, gait problem, joint pain, joint swelling, myalgias, muscle weakness, morning stiffness, muscle tenderness and  myalgias.  Skin:  Positive for hair loss and sensitivity to sunlight. Negative for color change and rash.  Allergic/Immunologic: Negative for susceptible to infections.  Neurological:  Positive for headaches. Negative for dizziness.  Hematological:  Negative for swollen glands.   Psychiatric/Behavioral:  Positive for depressed mood and sleep disturbance. The patient is nervous/anxious.     PMFS History:  Patient Active Problem List   Diagnosis Date Noted   Encounter for Medicare annual wellness exam 01/21/2024   Cervical vertebral fusion 12/07/2023   Cervical myelopathy (HCC) 12/07/2023   Pain and swelling of left wrist 12/07/2023   Head injury 11/19/2023   Healthcare maintenance 06/25/2023   Rhinorrhea 06/25/2023   Overweight (BMI 25.0-29.9) 06/25/2023   Encounter for annual wellness exam in Medicare patient 12/25/2022   Stress incontinence 12/04/2022   Neuropathy 12/04/2022   Dyslipidemia 08/04/2022   Primary hypertension 07/02/2022   Fibromyalgia 07/02/2022   Aortic atherosclerosis (HCC) 07/02/2022   Raynaud's disease without gangrene 07/02/2022   Seasonal allergic rhinitis due to pollen 03/27/2022   NAFLD (nonalcoholic fatty liver disease) 96/75/7977   Chronic insomnia 09/30/2020   Sjogren's syndrome (HCC) 06/26/2017   Osteopenia 01/07/2017    Past Medical History:  Diagnosis Date   Arthritis    Autoimmune disease (HCC)    multiple autoimmune system problems   Basal cell carcinoma 10/23/2022   Left upper arm. Nodular, infiltrative type. 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   Hyperlipidemia 02/04/2024   Hypertension    Long-term use of immunosuppressant medication 08/06/2022   Lumbar spondylitis (HCC)    Lumbar spondylosis 08/06/2022   Neoplasm of rectum 02/04/2024   Osteoarthritis of carpometacarpal (CMC) joint of thumb 11/26/2023   Osteoarthritis of right knee  06/04/2023   Pain in joint of left knee 05/14/2023   Pain of right thumb 11/26/2023   Postoperative nausea 08/07/2023   Raynaud's disease    Sjogren syndrome, unspecified (HCC) 06/26/2017   Sjogren's disease (HCC)    Swelling of joint of left wrist 11/26/2023   Tear of meniscus of knee 05/16/2023   TMJ (dislocation of temporomandibular joint)     Family History  Problem Relation Age of Onset   Stroke Mother    Hypertension Mother    Breast cancer Mother    Cancer Mother    Arthritis Mother    Stroke Father    Heart failure Father    Heart failure Brother    Heart failure Brother    Heart failure Brother    Heart failure Brother    Cancer Maternal Grandmother    Cancer Maternal Grandfather    Past Surgical History:  Procedure Laterality Date   ABDOMINAL HYSTERECTOMY     BELPHAROPTOSIS REPAIR Bilateral 07/2016   BLADDER SURGERY     BURCH PROCEDURE     CARPAL TUNNEL RELEASE Bilateral    COLON RESECTION  10/2021   REPLACEMENT TOTAL KNEE Right 07/31/2023   Social History   Social History Narrative   Not on file   Immunization History  Administered Date(s) Administered   Fluzone Influenza virus vaccine,trivalent (IIV3), split virus 08/04/2014   INFLUENZA, HIGH DOSE SEASONAL  PF 08/20/2019   Influenza,inj,Quad PF,6+ Mos 09/23/2014, 07/17/2015, 09/16/2016   Influenza-Unspecified 07/18/2022   Moderna Sars-Covid-2 Vaccination 12/14/2019, 01/11/2020   Pneumococcal Polysaccharide-23 04/07/2019   Zoster Recombinant(Shingrix) 07/18/2022, 09/18/2022     Objective: Vital Signs: BP 133/68 (BP Location: Right Arm, Patient Position: Sitting, Cuff Size: Normal)   Pulse 67   Resp 17   Ht 5' (1.524 m)   Wt 151 lb 12.8 oz (68.9 kg)   BMI 29.65 kg/m    Physical Exam HENT:     Mouth/Throat:     Mouth: Mucous membranes are moist.     Pharynx: Oropharynx is clear.  Eyes:     Conjunctiva/sclera: Conjunctivae normal.  Cardiovascular:     Rate and Rhythm: Normal rate and  regular rhythm.  Pulmonary:     Effort: Pulmonary effort is normal.     Breath sounds: Normal breath sounds.  Lymphadenopathy:     Cervical: No cervical adenopathy.  Skin:    General: Skin is warm and dry.     Findings: Rash present.     Comments: 1+ pitting edema on legs, no overlying erythema, few superficial tortuous varicosities No digital pitting Normal appearing nailfold capillaries  Neurological:     Mental Status: She is alert.  Psychiatric:        Mood and Affect: Mood normal.      Musculoskeletal Exam:  Neck stiffness increased muscle tone paraspinal muscles Increased thoracic kyphosis with Dowager's hump, diffuse tenderness to pressure with radiation Shoulders full ROM no tenderness or swelling Elbows full ROM no tenderness or swelling Wrists full ROM no tenderness or swelling Fingers extensive PIP and DIP heberdon's nodes b/l, lateral deviations in 2nd DIPs, left 1st CMC joint squaring Right knee postsurgical changes, extension about 80 degrees flexion about 100 degrees, left knee full ROM, tenderness to pressure and with movement on lateral side Ankles limited dorsiflexion with tight achilles and calf muscles, decreased eversion ROM without pain    Investigation: No additional findings.  Imaging: No results found.  Recent Labs: Lab Results  Component Value Date   WBC 8.6 01/13/2024   HGB 14.6 01/13/2024   PLT 329 01/13/2024   NA 144 01/13/2024   K 4.7 01/13/2024   CL 103 01/13/2024   CO2 24 01/13/2024   GLUCOSE 91 01/13/2024   BUN 18 01/13/2024   CREATININE 0.91 01/13/2024   BILITOT 0.5 01/13/2024   ALKPHOS 66 01/13/2024   AST 12 01/13/2024   ALT 12 01/13/2024   PROT 6.6 01/13/2024   ALBUMIN 4.2 01/13/2024   CALCIUM  10.2 01/13/2024    Speciality Comments: No specialty comments available.  Procedures:  No procedures performed Allergies: Zolpidem, Baclofen, Levofloxacin, Orphenadrine, Pregabalin, Tizanidine, Trazodone, Trazodone and  nefazodone, and Celecoxib   Assessment / Plan:     Sjogren's syndrome, with unspecified organ involvement (HCC) - Plan: Sedimentation rate, C-reactive protein, C3 and C4, RNP Antibody, Anti-Smith antibody, Sjogrens syndrome-A extractable nuclear antibody, Sjogrens syndrome-B extractable nuclear antibody, Anti-DNA antibody, double-stranded Chronic Sjogren's with dryness affecting eyes, mouth, skin, and nails. Hydroxychloroquine ineffective for joint pain or dryness. - Continue hydroxychloroquine 200 mg daily. - Continue Retain eye drops and ointment. - Use Magic Mouthwash and Oxifresh for dry mouth and ulcers. - Order blood tests for serum inflammatory markers and antibody markers for Sjogren's syndrome.  Generalized osteoarthritis - Plan: XR Hand 2 View Right, XR Hand 2 View Left, traMADol  (ULTRAM ) 50 MG tablet, meloxicam  (MOBIC ) 7.5 MG tablet Severe osteoarthritis with significant joint pain, especially in right knee  post total knee replacement. Persistent pain and swelling. Bilateral degenerative meniscal tears. Limited benefit from meloxicam  and steroid injections. - Order x-rays of hands to assess for osteoarthritis degree or any inflammatory changes. - Discontinue gabapentin . - Increase meloxicam  to 7.5 mg daily if tolerated. - Prescribe tramadol  for short-term pain management during physical therapy. - Follow up with ultrasound of knee if swelling persists.  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 Symptoms consistent with pseudogout, contributing to joint pain and inflammation.  Fibromyalgia syndrome Chronic fibromyalgia with widespread pain. Gabapentin  ineffective. Discussed fibromyalgia as nerve-mediated pain condition. - Discontinue gabapentin . - Provide educational materials on fibromyalgia management, including non-medication strategies. - Consider alternative medications such as Cymbalta in future  follow-up.  Chronic bilateral ankle pain Chronic bilateral ankle pain with limited mobility. Previous steroid injections ineffective. Limited dorsiflexion and inversion/eversion due to stiffness and possible osteoarthritis. - Provide stretching exercises for lower leg and ankle. - Consider further evaluation if symptoms persist.  Temporomandibular joint (TMJ) disorder TMJ disorder with jaw pain radiating to ear and neck. Symptoms exacerbated by stress and teeth grinding. - Consider use of a bite guard for TMJ management.  Chronic insomnia Chronic insomnia with difficulty falling asleep and maintaining sleep. Clonazepam  with limited effectiveness. Sleep disturbances due to racing thoughts and stress.    Orders: Orders Placed This Encounter  Procedures   XR Hand 2 View Right   XR Hand 2 View Left   Sedimentation rate   C-reactive protein   C3 and C4   RNP Antibody   Anti-Smith antibody   Sjogrens syndrome-A extractable nuclear antibody   Sjogrens syndrome-B extractable nuclear antibody   Anti-DNA antibody, double-stranded   Meds ordered this encounter  Medications   traMADol  (ULTRAM ) 50 MG tablet    Sig: Take 1 tablet (50 mg total) by mouth every 12 (twelve) hours as needed.    Dispense:  10 tablet    Refill:  0   meloxicam  (MOBIC ) 7.5 MG tablet    Sig: Take 1 tablet (7.5 mg total) by mouth daily.    Dispense:  30 tablet    Refill:  0     Follow-Up Instructions: Return in about 3 weeks (around 07/05/2024) for New pt f/u 2-4 wks.   Grace LELON Ester, MD  Note - This record has been created using [employer].  Chart creation errors have been sought, but may not always  have been located. Such creation errors do not reflect on  the standard of medical care.

## 2024-06-14 ENCOUNTER — Ambulatory Visit: Attending: Internal Medicine | Admitting: Internal Medicine

## 2024-06-14 ENCOUNTER — Ambulatory Visit

## 2024-06-14 ENCOUNTER — Telehealth: Payer: Self-pay | Admitting: Gastroenterology

## 2024-06-14 ENCOUNTER — Encounter: Payer: Self-pay | Admitting: Internal Medicine

## 2024-06-14 VITALS — BP 133/68 | HR 67 | Resp 17 | Ht 60.0 in | Wt 151.8 lb

## 2024-06-14 DIAGNOSIS — M159 Polyosteoarthritis, unspecified: Secondary | ICD-10-CM

## 2024-06-14 DIAGNOSIS — M797 Fibromyalgia: Secondary | ICD-10-CM | POA: Diagnosis not present

## 2024-06-14 DIAGNOSIS — I7300 Raynaud's syndrome without gangrene: Secondary | ICD-10-CM

## 2024-06-14 DIAGNOSIS — G629 Polyneuropathy, unspecified: Secondary | ICD-10-CM

## 2024-06-14 DIAGNOSIS — M3500 Sicca syndrome, unspecified: Secondary | ICD-10-CM

## 2024-06-14 MED ORDER — TRAMADOL HCL 50 MG PO TABS: 50.0000 mg | ORAL_TABLET | Freq: Two times a day (BID) | ORAL | 0 refills | Status: DC | PRN

## 2024-06-14 MED ORDER — MELOXICAM 7.5 MG PO TABS
7.5000 mg | ORAL_TABLET | Freq: Every day | ORAL | 0 refills | Status: DC
Start: 2024-06-14 — End: 2024-07-07

## 2024-06-14 NOTE — Telephone Encounter (Signed)
 Good afternoon Dr. Albertus, I received a call from this patient stating that she would like to schedule an appointment to see you since you where highly recommend to her. Patient was last seen with [facility] back in April and was suppose to be schedule to have  a colonoscopy done with one of the Doctors there but the left and was not able to schedule anything. Patient states that she has diverticulitis and has also had a colon resection. Would you please advise on how to schedule this patient.    Thank you.

## 2024-06-14 NOTE — Patient Instructions (Addendum)
 I recommend checking out the [employer]  patient-centered guide for fibromyalgia and chronic pain management: https://howell-gardner.net/    I recommend symptom treatments for eye dryness including lubricating eye drops and can use gel or ointment based products for overnight.  Also consider use of humidifier at night during dry weather.  Use follow-up regularly with your eye doctor.  If symptoms worsen there are several types of medicated eyedrop that can help with the dryness or inflammation.  For chronic dry mouth is important to stay well-hydrated.  You can also use sugar-free gum or lozenges to stimulate the saliva production.  Biotene mouthwash or lozenges may also be helpful.  Products into [employer]ts also work to stimulate saliva production.  Continue following up with your dentist regularly because dryness problems can increase the risk of tooth and gum decay.  If symptoms get worse there are medications for stimulating tear and saliva production but will try all the other options first.

## 2024-06-15 LAB — ANTI-DNA ANTIBODY, DOUBLE-STRANDED: ds DNA Ab: <1 [IU]/mL

## 2024-06-15 LAB — SJOGRENS SYNDROME-A EXTRACTABLE NUCLEAR ANTIBODY: SSA (Ro) (ENA) Antibody, IgG: <1 AI

## 2024-06-15 LAB — SJOGRENS SYNDROME-B EXTRACTABLE NUCLEAR ANTIBODY: SSB (La) (ENA) Antibody, IgG: <1 AI

## 2024-06-15 LAB — ANTI-SMITH ANTIBODY: ENA SM Ab Ser-aCnc: <1 AI

## 2024-06-15 LAB — SEDIMENTATION RATE: Sed Rate: 11 mm/h (ref 0–30)

## 2024-06-15 LAB — RNP ANTIBODY: Ribonucleic Protein(ENA) Antibody, IgG: <1 AI

## 2024-06-15 LAB — C3 AND C4
C3 Complement: 162 mg/dL (ref 83–193)
C4 Complement: 26 mg/dL (ref 15–57)

## 2024-06-15 LAB — C-REACTIVE PROTEIN: CRP: 9.6 mg/L — ABNORMAL HIGH (ref <8.0)

## 2024-06-15 NOTE — Telephone Encounter (Signed)
 Called patient to advise of your recommendation and she stated she will wait for Dr. Albertus November calendar to come out in order to schedule an appointment.

## 2024-06-15 NOTE — Telephone Encounter (Signed)
 Ok to sch with me or APP in POD A

## 2024-06-16 ENCOUNTER — Ambulatory Visit: Payer: Self-pay | Admitting: Internal Medicine

## 2024-06-16 NOTE — Progress Notes (Signed)
 Lab results look good her antibody markers are all normal.  The sedimentation rate and complement tests are also normal.  Her CRP is just slightly high at 9.6.  Hand x-rays show significant osteoarthritis worst at the base of the thumb and at the tips of the fingers which is probably the main cause of her pain or stiffness in those areas.

## 2024-07-06 NOTE — Progress Notes (Signed)
 Office Visit Note  Patient: Grace Wheeler             Date of Birth: [birth year]           MRN: [id number]             PCP: Sharma Coyer, MD Referring: Sharma Bullocks* Visit Date: 07/07/2024   Subjective:  Pain of the Right Wrist (Immobility , throbbing), Pain of the Right Shoulder (Immobility), and Follow-up  Discussed the use of AI scribe software for clinical note transcription with the patient, who gave verbal consent to proceed.  History of Present Illness   Grace Wheeler is a 78 year old female with osteoarthritis and Sjogren's syndrome who presents with joint pain and dryness symptoms.  She has been experiencing joint pain and stiffness, particularly in the knees and shoulders. She stopped taking gabapentin  and started meloxicam  once daily, but notes no significant improvement in symptoms. Her knee is wrapped, and physical therapy has taped it. She experiences significant shoulder pain, especially when lifting her arm, and describes the pain as originating from the joint area. She has a history of bursitis in the shoulder and has received injections in the past, though the most recent injection was less effective.  She experiences wrist pain, especially when lifting objects, and describes the pain as radiating around the wrist.  She reports persistent eye dryness, using Riton multiple times a day and ointment at night. She has tried various prescription treatments, including hydroxyzine, Xiidra, and Restasis, but experienced side effects or insurance coverage issues. She has reverted to using Riton due to these challenges.  She experiences significant mouth dryness, particularly at night, requiring her to keep water by her bed. She previously took pilocarpine  but discontinued it due to cost. She recalls taking it while living in [place]  about seven to eight years ago and still has the bottle at home. She does not recall the dosage but mentions it was effective at the  time.       Previous HPI 06/14/24 Grace Wheeler is a 78 year old female with Sjogren's syndrome, osteoarthritis, and fibromyalgia who presents for management of her chronic pain and joint issues. She is accompanied by her husband, Zachary. She was previously seen by Dr. Tobie for rheumatology care.   She experiences significant dryness due to Sjogren's syndrome, affecting her eyes, mouth, hair, skin, and nails. She has been on hydroxychloroquine  200 mg daily for about 25 years without consistent noticeable improvement. She uses Retain eye drops during the day and ointment at night, having tried Restasis and Xiidra with less effectiveness or side effects. For dry mouth, she drinks water and uses Magic Mouthwash and Oxifresh for mouth sores.   A knee replacement nearly a year ago did not resolve her pain. She experiences significant pain and stiffness, especially after physical therapy. She has not had fluid removed from the knee but has had steroid injections in both knees and ankles without relief. 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   She experiences chronic pain from fibromyalgia, described as nerve-related with heightened sensitivity. Gabapentin  100 mg twice daily for about a year has not provided significant relief. She takes meloxicam  7.5 mg every other day due to gastrointestinal side effects and Tylenol  Arthritis 650 mg, two to four times daily, for pain management. She previously experienced migraines with fibromyalgia but not currently.   She uses a cane for mobility, especially outside the home, due to pain and instability from her knee and ankle issues. She has a  history of meniscus tears in both knees, exacerbated by everyday activities. 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   Her sleep is poor, often taking clonazepam  1 mg before bed, but she struggles with falling asleep due to racing thoughts and wakes frequently. She manages household  tasks and finances but sometimes questions her concentration and memory.   She has a history of gout and neuropathy, contributing to her overall pain and discomfort. She has not had recent x-rays of her hands or back.    Review of Systems  Constitutional:  Positive for fatigue.  HENT:  Positive for mouth sores and mouth dryness.   Eyes:  Positive for dryness.  Respiratory:  Negative for shortness of breath.   Cardiovascular:  Positive for palpitations. Negative for chest pain.  Gastrointestinal:  Negative for blood in stool, constipation and diarrhea.  Endocrine: Negative for increased urination.  Genitourinary:  Positive for involuntary urination.  Musculoskeletal:  Positive for joint pain, joint pain, joint swelling, myalgias, muscle weakness, morning stiffness, muscle tenderness and myalgias. Negative for gait problem.  Skin:  Positive for color change and sensitivity to sunlight. Negative for rash and hair loss.  Allergic/Immunologic: Negative for susceptible to infections.  Neurological:  Negative for dizziness and headaches.  Hematological:  Negative for swollen glands.  Psychiatric/Behavioral:  Positive for sleep disturbance. Negative for depressed mood. The patient is nervous/anxious.     PMFS History:  Patient Active Problem List   Diagnosis Date Noted   Encounter for Medicare annual wellness exam 01/21/2024   Cervical vertebral fusion 12/07/2023   Cervical myelopathy (HCC) 12/07/2023   Healthcare maintenance 06/25/2023   Overweight (BMI 25.0-29.9) 06/25/2023   Encounter for annual wellness exam in Medicare patient 12/25/2022   Stress incontinence 12/04/2022   Neuropathy 12/04/2022   Dyslipidemia 08/04/2022   Primary hypertension 07/02/2022   Fibromyalgia 07/02/2022   Aortic atherosclerosis 07/02/2022   Raynaud's disease without gangrene 07/02/2022   Seasonal allergic rhinitis due to pollen 03/27/2022   NAFLD (nonalcoholic fatty liver disease) 96/75/7977   Chronic  insomnia 09/30/2020   Generalized osteoarthritis of multiple sites 10/07/2017   Sjogren's syndrome 06/26/2017   Osteopenia 01/07/2017    Past Medical History:  Diagnosis Date   Allergy 2015   Seasonal   Anxiety 1989   Never relaxed   Arthritis    Autoimmune disease    multiple autoimmune system problems   Basal cell carcinoma 10/23/2022   Left upper arm. Nodular, infiltrative type. 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   Osteoarthritis of carpometacarpal (CMC) joint of thumb 11/26/2023   Osteoarthritis of right knee 06/04/2023   Osteoporosis 2004   Osteopenia   Pain in joint of left knee 05/14/2023   Pain of right thumb 11/26/2023   Postoperative nausea 08/07/2023   Raynaud's disease    Sjogren syndrome, unspecified 06/26/2017   Sjogren's disease    Swelling of joint  of left wrist 11/26/2023   Tear of meniscus of knee 05/16/2023   TMJ (dislocation of temporomandibular joint)     Family History  Problem Relation Age of Onset   Stroke Mother    Hypertension Mother    Breast cancer Mother    Cancer Mother    Arthritis Mother    Stroke Father    Heart failure Father    Heart failure Brother    Heart failure Brother    Heart failure Brother    Heart failure Brother    Cancer Maternal Grandmother    Cancer Maternal Grandfather    Past Surgical History:  Procedure Laterality Date   ABDOMINAL  HYSTERECTOMY     BELPHAROPTOSIS REPAIR Bilateral 07/2016   BLADDER SURGERY     BURCH PROCEDURE     CARPAL TUNNEL RELEASE Bilateral    COLON RESECTION  10/2021   REPLACEMENT TOTAL KNEE Right 07/31/2023   Social History   Social History Narrative   Not on file   Immunization History  Administered Date(s) Administered   Fluzone Influenza virus vaccine,trivalent (IIV3), split virus 08/04/2014   INFLUENZA, HIGH DOSE SEASONAL PF 08/20/2019, 07/13/2024   Influenza,inj,Quad PF,6+ Mos 09/23/2014, 07/17/2015, 09/16/2016   Influenza-Unspecified 07/18/2022   Moderna Sars-Covid-2 Vaccination 12/14/2019, 01/11/2020   Pneumococcal Polysaccharide-23 04/07/2019   Zoster Recombinant(Shingrix) 07/18/2022, 09/18/2022     Objective: Vital Signs: BP 124/66 (BP Location: Left Arm, Patient Position: Sitting, Cuff Size: Normal)   Pulse 67   Resp 16   Ht 5' (1.524 m)   Wt 152 lb 9.6 oz (69.2 kg)   BMI 29.80 kg/m    Physical Exam Eyes:     Conjunctiva/sclera: Conjunctivae normal.  Cardiovascular:     Rate and Rhythm: Normal rate and regular rhythm.  Pulmonary:     Effort: Pulmonary effort is normal.     Breath sounds: Normal breath sounds.  Lymphadenopathy:     Cervical: No cervical adenopathy.  Skin:    General: Skin is warm and dry.     Comments: Few superficial tortuous varicosities No digital pittings   Neurological:     Mental Status: She is alert.  Psychiatric:        Mood and Affect: Mood normal.      Musculoskeletal Exam:  Neck stiffness increased muscle tone paraspinal muscles Increased thoracic kyphosis with Dowager's hump, diffuse tenderness to pressure with radiation Pain in shoulder on arm movement test and resisted arm movement. Full range of motion in shoulder without restriction Elbows full ROM no tenderness or swelling Mild pain in wrist on palpation.  Fingers extensive PIP and DIP heberdon's nodes b/l, lateral deviations in 2nd DIPs, left 1st CMC joint  squaring Right knee postsurgical changes and limited ROM, left knee full ROM, tenderness to pressure and with movement on lateral side  Investigation: No additional findings.  Imaging: No results found.  Recent Labs: Lab Results  Component Value Date   WBC 8.6 01/13/2024   HGB 14.6 01/13/2024   PLT 329 01/13/2024   NA 144 01/13/2024   K 4.7 01/13/2024   CL 103 01/13/2024   CO2 24 01/13/2024   GLUCOSE 91 01/13/2024   BUN 18 01/13/2024   CREATININE 0.91 01/13/2024   BILITOT 0.5 01/13/2024   ALKPHOS 66 01/13/2024   AST 12 01/13/2024   ALT 12 01/13/2024   PROT 6.6 01/13/2024   ALBUMIN 4.2 01/13/2024   CALCIUM  10.2 01/13/2024    Speciality Comments: No specialty comments available.  Procedures:  Large Joint Inj:  R subacromial bursa on 07/07/2024 4:40 PM Indications: pain and joint swelling Details: 27 G needle, lateral approach Medications: 2 mL lidocaine  1 %; 40 mg triamcinolone  acetonide 40 MG/ML Outcome: tolerated well, no immediate complications Procedure, treatment alternatives, risks and benefits explained, specific risks discussed. Consent was given by the patient. Immediately prior to procedure a time out was called to verify the correct patient, procedure, equipment, support staff and site/side marked as required. Patient was prepped and draped in the usual sterile fashion.     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 Current hydroxychloroquine  treatment adequate. Previous eye drops unsuccessful due to side effects and cost. Pilocarpine  discontinued due to cost; insurance coverage re-evaluation discussed. - Continue  hydroxychloroquine  200 mg daily. - Prescribe pilocarpine  5 mg at night.  Osteoarthritis, multiple joints Chronic osteoarthritis in multiple joints, notably shoulder and wrist. X-rays show typical changes without inflammatory arthritis. Blood tests negative for systemic inflammation or elevated antibodies. Focus on symptom management. - Continue Meloxicam  7.5 mg daily - Provide handout on dietary and supplement options for osteoarthritis. - Few tramadol  Rx for pain to help tolerated exercise of restricted knee joint - Follow up in 2-3 months to assess symptom management.  Right shoulder pain due to subacromial bursitis and possible rotator cuff tendinitis Right shoulder pain likely from subacromial bursitis and possible rotator cuff tendinitis. Previous injections provided limited relief. - Administer subacromial bursitis injection in the right shoulder.  Fibromyalgia syndrome No substantial change in symptoms with removal of gabapentin .    Orders: Orders Placed This Encounter  Procedures   Large Joint Inj: R subacromial bursa   Meds ordered this encounter  Medications   pilocarpine  (SALAGEN ) 5 MG tablet    Sig: Take 1 tablet (5 mg total) by mouth at bedtime.    Dispense:  90 tablet    Refill:  0   meloxicam  (MOBIC ) 7.5 MG tablet    Sig: Take 1 tablet (7.5 mg total) by mouth daily.    Dispense:  90 tablet    Refill:  0   hydroxychloroquine  (PLAQUENIL ) 200 MG tablet    Sig: Take 1 tablet (200 mg total) by mouth daily.    Dispense:  90 tablet    Refill:  0     Follow-Up Instructions: Return in about 3 months (around 10/06/2024) for pSS/OA on HCQ/NSAID/pilocarpine /inj f/u 3mos.   Lonni LELON Ester, MD  Note - This record has been created using [employer].  Chart creation errors have been sought, but may not always  have been located. Such creation errors do not reflect on  the standard of medical care.

## 2024-07-07 ENCOUNTER — Ambulatory Visit: Attending: Internal Medicine | Admitting: Internal Medicine

## 2024-07-07 ENCOUNTER — Encounter: Payer: Self-pay | Admitting: Internal Medicine

## 2024-07-07 VITALS — BP 124/66 | HR 67 | Resp 16 | Ht 60.0 in | Wt 152.6 lb

## 2024-07-07 DIAGNOSIS — M797 Fibromyalgia: Secondary | ICD-10-CM | POA: Diagnosis not present

## 2024-07-07 DIAGNOSIS — M25511 Pain in right shoulder: Secondary | ICD-10-CM

## 2024-07-07 DIAGNOSIS — M3500 Sicca syndrome, unspecified: Secondary | ICD-10-CM | POA: Diagnosis not present

## 2024-07-07 DIAGNOSIS — G8929 Other chronic pain: Secondary | ICD-10-CM

## 2024-07-07 DIAGNOSIS — M159 Polyosteoarthritis, unspecified: Secondary | ICD-10-CM | POA: Diagnosis not present

## 2024-07-07 MED ORDER — PILOCARPINE HCL 5 MG PO TABS: 5.0000 mg | ORAL_TABLET | Freq: Every day | ORAL | 0 refills | Status: DC

## 2024-07-07 MED ORDER — MELOXICAM 7.5 MG PO TABS: 7.5000 mg | ORAL_TABLET | Freq: Every day | ORAL | 0 refills | Status: DC

## 2024-07-07 MED ORDER — HYDROXYCHLOROQUINE SULFATE 200 MG PO TABS: 200.0000 mg | ORAL_TABLET | Freq: Every day | ORAL | 0 refills | Status: DC

## 2024-07-11 ENCOUNTER — Other Ambulatory Visit: Payer: Self-pay | Admitting: Internal Medicine

## 2024-07-11 DIAGNOSIS — M159 Polyosteoarthritis, unspecified: Secondary | ICD-10-CM

## 2024-07-13 ENCOUNTER — Ambulatory Visit: Admitting: Family Medicine

## 2024-07-13 ENCOUNTER — Encounter: Payer: Self-pay | Admitting: Family Medicine

## 2024-07-13 VITALS — BP 126/61 | HR 62 | Temp 97.9°F | Ht 60.0 in | Wt 147.6 lb

## 2024-07-13 DIAGNOSIS — Z23 Encounter for immunization: Secondary | ICD-10-CM

## 2024-07-13 DIAGNOSIS — M3500 Sicca syndrome, unspecified: Secondary | ICD-10-CM

## 2024-07-13 DIAGNOSIS — E663 Overweight: Secondary | ICD-10-CM

## 2024-07-13 DIAGNOSIS — F5104 Psychophysiologic insomnia: Secondary | ICD-10-CM

## 2024-07-13 DIAGNOSIS — I700 Atherosclerosis of aorta: Secondary | ICD-10-CM | POA: Diagnosis not present

## 2024-07-13 DIAGNOSIS — I10 Essential (primary) hypertension: Secondary | ICD-10-CM

## 2024-07-13 DIAGNOSIS — G629 Polyneuropathy, unspecified: Secondary | ICD-10-CM | POA: Diagnosis not present

## 2024-07-13 DIAGNOSIS — E785 Hyperlipidemia, unspecified: Secondary | ICD-10-CM | POA: Diagnosis not present

## 2024-07-13 DIAGNOSIS — M159 Polyosteoarthritis, unspecified: Secondary | ICD-10-CM

## 2024-07-13 DIAGNOSIS — M8580 Other specified disorders of bone density and structure, unspecified site: Secondary | ICD-10-CM

## 2024-07-13 DIAGNOSIS — I7300 Raynaud's syndrome without gangrene: Secondary | ICD-10-CM

## 2024-07-13 DIAGNOSIS — M797 Fibromyalgia: Secondary | ICD-10-CM

## 2024-07-13 MED ORDER — METFORMIN HCL ER 500 MG PO TB24: 500.0000 mg | ORAL_TABLET | Freq: Every day | ORAL | 2 refills | Status: DC

## 2024-07-13 NOTE — Progress Notes (Signed)
 "     Established patient visit   Patient: Grace Wheeler   DOB: 1945-12-11   78 y.o. Female  MRN: [id number] Visit Date: 07/13/2024  Today's healthcare provider: Rockie Agent, MD   Chief Complaint  Patient presents with   Chronic Conditions   Subjective       Discussed the use of AI scribe software for clinical note transcription with the patient, who gave verbal consent to proceed.  History of Present Illness Pieper Kasik is a 78 year old female who presents for a chronic follow-up.  She continues to take clonazepam  1 mg at bedtime for anxiety and insomnia. She is also on Voltaren gel and meloxicam  7.5 mg daily for osteoarthritis, and hydroxychloroquine  for her condition. She recently received a cortisone injection in her right shoulder for arthritis-related pain.  She is on Crestor  10 mg daily for hyperlipidemia and tramadol  50 mg every 12 hours as needed for chronic pain. Her knee has recently started to improve, allowing her to get around better. Her rheumatologist recently discontinued gabapentin  due to its low dose and increased her meloxicam  to daily use.  She is taking pilocarpine  5 mg at bedtime for Sjogren's syndrome and it has helped with her dry mouth symptoms. She did not wake up with a dry mouth during the night after starting this medication.  She wants to lose weight, aiming to reach 127-130 pounds. She has a sweet tooth and difficulty with dieting and exercising, noting that she and her husband enjoy eating and have a routine of having sweets at night. She has lost some weight since August, going from 151 to 147 pounds.  She has a history of three open surgeries, including a partial colon removal, and has been told she has a lot of scar tissue. She is concerned about the impact of this on her weight and body.  She eats a lot of grilled chicken and eggs, and occasionally red meat, but does not like fish. She is concerned about her sweet tooth but has  maintained a normal A1c level of 5.5 as of March.     Past Medical History:  Diagnosis Date   Allergy 2015   Seasonal   Anxiety 1989   Never relaxed   Arthritis    Autoimmune disease (HCC)    multiple autoimmune system problems   Basal cell carcinoma 10/23/2022   Left upper arm. Nodular, infiltrative type. 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   Hyperlipidemia 02/04/2024   Hypertension    Long-term use of immunosuppressant medication 08/06/2022   Lumbar spondylitis (HCC)    Lumbar spondylosis 08/06/2022   Neoplasm of rectum 02/04/2024   Osteoarthritis of carpometacarpal (CMC) joint of thumb 11/26/2023   Osteoarthritis of right knee 06/04/2023   Osteoporosis 2004   Osteopenia   Pain in joint of left knee 05/14/2023   Pain of right thumb 11/26/2023   Postoperative nausea 08/07/2023   Raynaud's disease    Sjogren syndrome, unspecified (HCC) 06/26/2017   Sjogren's disease (HCC)    Swelling of joint of left wrist 11/26/2023   Tear of meniscus of knee 05/16/2023   TMJ (  dislocation of temporomandibular joint)     Medications: Outpatient Medications Prior to Visit  Medication Sig   acetaminophen  (TYLENOL ) 650 MG CR tablet Tylenol  Arthritis Pain 650 mg tablet,extended release  Take 1 tablet twice a day by oral route.   Cholecalciferol (VITAMIN D3) 50 MCG (2000 UT) CHEW Chew by mouth.   clonazePAM  (KLONOPIN ) 1 MG tablet TAKE 1 TABLET BY MOUTH AT  BEDTIME   desonide (DESOWEN) 0.05 % cream Apply topically 2 (two) times daily. (Patient taking differently: Apply topically as needed.)   diclofenac Sodium  (VOLTAREN) 1 % GEL diclofenac 1 % topical gel  APPLY 2 GRAM TO THE AFFECTED AREA(S) BY TOPICAL ROUTE 4 TIMES PER DAY   fluticasone  (FLONASE ) 50 MCG/ACT nasal spray Place into both nostrils daily.   hydrochlorothiazide  (HYDRODIURIL ) 25 MG tablet TAKE 1 TABLET BY MOUTH DAILY   hydroxychloroquine  (PLAQUENIL ) 200 MG tablet Take 1 tablet (200 mg total) by mouth daily.   Light Mineral Oil-Mineral Oil 0.5-0.5 % EMUL Retaine MGD (PF) 0.5 %-0.5 % eye drops in a dropperette  As Needed   magic mouthwash SOLN Take 5 mLs by mouth as needed for mouth pain. Suspension contains equal amounts of Maalox Extra Strength, nystatin, and diphenhydramine.   meloxicam  (MOBIC ) 7.5 MG tablet Take 1 tablet (7.5 mg total) by mouth daily.   Menthol, Topical Analgesic, 6 % GEL Apply topically.   metoprolol succinate (TOPROL-XL) 50 MG 24 hr tablet metoprolol succinate ER 50 mg tablet,extended release 24 hr  TAKE 1 TABLET BY MOUTH AT  BEDTIME   pilocarpine  (SALAGEN ) 5 MG tablet Take 1 tablet (5 mg total) by mouth at bedtime.   rosuvastatin  (CRESTOR ) 10 MG tablet Take 1 tablet (10 mg total) by mouth daily.   traMADol  (ULTRAM ) 50 MG tablet Take 1 tablet (50 mg total) by mouth every 12 (twelve) hours as needed.   [DISCONTINUED] carboxymethylcellulose (REFRESH PLUS) 0.5 % SOLN 1 drop 3 (three) times daily as needed.   No facility-administered medications prior to visit.    Review of Systems  Last metabolic panel Lab Results  Component Value Date   GLUCOSE 91 01/13/2024   NA 144 01/13/2024   K 4.7 01/13/2024   CL 103 01/13/2024   CO2 24 01/13/2024   BUN 18 01/13/2024   CREATININE 0.91 01/13/2024   EGFR 66 01/13/2024   CALCIUM  10.2 01/13/2024   PROT 6.6 01/13/2024   ALBUMIN 4.2 01/13/2024   LABGLOB 2.4 01/13/2024   BILITOT 0.5 01/13/2024   ALKPHOS 66 01/13/2024   AST 12 01/13/2024   ALT 12 01/13/2024   ANIONGAP 8 03/16/2022   Last lipids Lab Results  Component Value Date   CHOL 148 01/13/2024   HDL 71  01/13/2024   LDLCALC 59 01/13/2024   TRIG 102 01/13/2024   CHOLHDL 2.1 01/13/2024   Last hemoglobin A1c Lab Results  Component Value Date   HGBA1C 5.5 01/13/2024        Objective    BP 126/61 (BP Location: Right Arm, Patient Position: Sitting, Cuff Size: Normal)   Pulse 62   Temp 97.9 F (36.6 C) (Oral)   Ht 5' (1.524 m)   Wt 147 lb 9.6 oz (67 kg)   SpO2 99%   BMI 28.83 kg/m  BP Readings from Last 3 Encounters:  07/13/24 126/61  07/07/24 124/66  06/14/24 133/68   Wt Readings from Last 3 Encounters:  07/13/24 147 lb 9.6 oz (67 kg)  07/07/24 152 lb 9.6 oz (69.2 kg)  06/14/24 151 lb 12.8  oz (68.9 kg)        Physical Exam Vitals reviewed.  Constitutional:      General: She is not in acute distress.    Appearance: Normal appearance. She is not ill-appearing.  Cardiovascular:     Rate and Rhythm: Normal rate and regular rhythm.  Pulmonary:     Effort: Pulmonary effort is normal. No respiratory distress.     Breath sounds: No wheezing, rhonchi or rales.  Neurological:     Mental Status: She is alert and oriented to person, place, and time.  Psychiatric:        Mood and Affect: Mood normal.        Behavior: Behavior normal.       No results found for any visits on 07/13/24.   Assessment & Plan     Problem List Items Addressed This Visit     Aortic atherosclerosis (HCC)   Chronic condition  Continue crestor  10mg  daily       Chronic insomnia   Chronic insomnia Managed with clonazepam . - Continue clonazepam  1 mg at bedtime      Dyslipidemia   Chronic condition managed with Crestor . No recent lipid panel available. The 10-year ASCVD risk score (Arnett DK, et al., 2019) is: 22.6% Last LDL within goal range, 59  - Continue Crestor  10 mg daily       Fibromyalgia   Chronic condition with ongoing management. Managed by rheumatology  - Continue Tylenol  650 mg CR twice daily - continue meloxicam  7.5mg  daily  - continue tramadol  50mg  every 12 hours  PRN  - continue voltaren gel PRN       Generalized osteoarthritis of multiple sites   Chronic  Continue tramadol  50mg  BID PRN  Voltaren gel  On plaquenil  200mg  daily  Follow up with rheum as scheduled       Neuropathy   Chronic neuropathy symptoms On tramadol  PRN   Gabapentin  discontinued by rheumatologist due to low dose and lack of efficacy.      Osteopenia   Chronic  Continue Vitamin D3, 2000 units daily  Goal 1200mg  calcium  daily       Overweight (BMI 25.0-29.9) - Primary   Chronic  BMI in overweight category  Trial of metformin  500mg  daily to see if this aids in weight loss  Recommended physical activity as tolerated given joint pains       Relevant Medications   metFORMIN  (GLUCOPHAGE -XR) 500 MG 24 hr tablet   Primary hypertension   Essential hypertension, Chronic  Well-controlled with current medication regimen. Blood pressure at 126/61 mmHg. - Continue current hypertension management: metoprolol 50mg  daily and hydrochlorothiazide  25mg  daily      Raynaud's disease without gangrene   Chronic  F/u with rheum as scheduled       Sjogren's syndrome (HCC)   Chronic symptoms well controlled on pilocarpine  5mg g qhs F/u with rhuem as scheduled       Other Visit Diagnoses       Immunization due       Relevant Orders   Flu vaccine HIGH DOSE PF(Fluzone Trivalent) (Completed)        Assessment & Plan  Chronic osteoarthritis of multiple joints  Chronic condition affecting multiple sites. Recent cortisone injection in the right shoulder. Meloxicam  increased to daily use by rheumatologist. - Continue Voltaren gel for osteoarthritis - Continue meloxicam  7.5 mg daily   General Health Maintenance Influenza vaccination discussed and approved. Eligible for high-dose flu vaccine due to age. - Administer high-dose influenza vaccine  Return in about 3 months (around 10/12/2024) for Weight MGMT.         Rockie Agent, MD  [facility] [phone] (phone) [phone] (fax)  [facility] "

## 2024-07-13 NOTE — Patient Instructions (Signed)
 To keep you healthy, please keep in mind the following health maintenance items that you are due for:   Health Maintenance Due  Topic Date Due   Hepatitis C Screening  Never done   DTaP/Tdap/Td (1 - Tdap) Never done   Influenza Vaccine  06/04/2024     Best Wishes,   Dr. Lang

## 2024-07-14 NOTE — Assessment & Plan Note (Signed)
 Chronic  BMI in overweight category  Trial of metformin  500mg  daily to see if this aids in weight loss  Recommended physical activity as tolerated given joint pains

## 2024-07-14 NOTE — Assessment & Plan Note (Signed)
 Chronic  Continue tramadol  50mg  BID PRN  Voltaren gel  On plaquenil  200mg  daily  Follow up with rheum as scheduled

## 2024-07-14 NOTE — Assessment & Plan Note (Signed)
 Chronic symptoms well controlled on pilocarpine  5mg g qhs F/u with rhuem as scheduled

## 2024-07-14 NOTE — Assessment & Plan Note (Signed)
 Chronic  Continue Vitamin D3, 2000 units daily  Goal 1200mg  calcium  daily

## 2024-07-14 NOTE — Assessment & Plan Note (Signed)
 Chronic insomnia Managed with clonazepam . - Continue clonazepam  1 mg at bedtime

## 2024-07-14 NOTE — Assessment & Plan Note (Signed)
 Chronic condition managed with Crestor . No recent lipid panel available. The 10-year ASCVD risk score (Arnett DK, et al., 2019) is: 22.6% Last LDL within goal range, 59  - Continue Crestor  10 mg daily

## 2024-07-14 NOTE — Assessment & Plan Note (Addendum)
 Chronic neuropathy symptoms On tramadol  PRN   Gabapentin  discontinued by rheumatologist due to low dose and lack of efficacy.

## 2024-07-14 NOTE — Assessment & Plan Note (Signed)
 Chronic condition  Continue crestor  10mg  daily

## 2024-07-14 NOTE — Assessment & Plan Note (Signed)
 Essential hypertension, Chronic  Well-controlled with current medication regimen. Blood pressure at 126/61 mmHg. - Continue current hypertension management: metoprolol 50mg  daily and hydrochlorothiazide  25mg  daily

## 2024-07-14 NOTE — Assessment & Plan Note (Signed)
 Chronic  F/u with rheum as scheduled

## 2024-07-14 NOTE — Assessment & Plan Note (Signed)
 Chronic condition with ongoing management. Managed by rheumatology  - Continue Tylenol  650 mg CR twice daily - continue meloxicam  7.5mg  daily  - continue tramadol  50mg  every 12 hours PRN  - continue voltaren gel PRN

## 2024-07-23 ENCOUNTER — Ambulatory Visit: Admitting: Family Medicine

## 2024-07-27 ENCOUNTER — Encounter: Payer: Self-pay | Admitting: Physician Assistant

## 2024-07-28 MED ORDER — TRIAMCINOLONE ACETONIDE 40 MG/ML IJ SUSP
40.0000 mg | INTRAMUSCULAR | Status: AC | PRN
  Administered 2024-07-07: 40 mg via INTRA_ARTICULAR

## 2024-07-28 MED ORDER — LIDOCAINE HCL 1 % IJ SOLN
2.0000 mL | INTRAMUSCULAR | Status: AC | PRN
  Administered 2024-07-07: 2 mL

## 2024-09-11 ENCOUNTER — Encounter: Payer: Self-pay | Admitting: Family Medicine

## 2024-09-11 DIAGNOSIS — G4700 Insomnia, unspecified: Secondary | ICD-10-CM

## 2024-09-12 ENCOUNTER — Other Ambulatory Visit: Payer: Self-pay | Admitting: Internal Medicine

## 2024-09-12 DIAGNOSIS — M159 Polyosteoarthritis, unspecified: Secondary | ICD-10-CM

## 2024-09-12 DIAGNOSIS — M3500 Sicca syndrome, unspecified: Secondary | ICD-10-CM

## 2024-09-15 ENCOUNTER — Ambulatory Visit: Admitting: Internal Medicine

## 2024-09-16 NOTE — Progress Notes (Signed)
 Ellouise Console, PA-C [address] [place], [place]  [address] Phone: [phone]   Gastroenterology Consultation  Referring Provider:     Sharma Bullocks* Primary Care Physician:  Sharma Coyer, MD Primary Gastroenterologist:  Ellouise Console, PA-C / Dr. Gordy Starch  Reason for Consultation:     Discuss colonoscopy        HPI:   Discussed the use of AI scribe software for clinical note transcription with the patient, who gave verbal consent to proceed. History of Present Illness Grace Wheeler is a 78 year old female with a history of diverticulitis and colon polyps who presents for evaluation of gastrointestinal symptoms and to discuss the need for a repeat colonoscopy.  She experienced three episodes of diverticulitis in 2022, occurring in April, August, and December, leading to a sigmoid colectomy in December 2022 in [place] . Since the surgery, she has been experiencing bloating and gas, which she describes as 'aggravating' and sometimes 'embarrassing.' She feels her digestive system is slower, gets full quickly, and experiences a sensation of fullness in the upper abdomen. No pain is associated with these symptoms.  She has history of irritable bowel syndrome.  She has had episodes of diarrhea alternating with constipation.  Most recently she has had some constipation.  She has a bowel movement every other day on average and denies seeing blood in her stool. Occasionally, she experiences hard stools and straining. She is not currently taking any medication for constipation.  She tried MiraLAX in the past which cause gas and bloating and discontinued.  She has a history of colon polyps, with a small tubular adenoma found in December 2022. She has had polyps detected in previous colonoscopies as well.  Her past medical history includes Sjogren's disease, osteoarthritis, fibromyalgia, and a total knee replacement last year. She is not on any blood thinners. No  personal history of heart, lung, or kidney disease but has a significant family history of heart disease, with her father and five brothers having died of heart attacks before the age of 45.  PMH hypertension, NAFLD, Sjogren syndrome, fibromyalgia, GERD.  Past Medical History:  Diagnosis Date   Allergy 2015   Seasonal   Anxiety 1989   Never relaxed   Arthritis    Autoimmune disease    multiple autoimmune system problems   Basal cell carcinoma 10/23/2022   Left upper arm. Nodular, infiltrative type. 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   Osteoarthritis of carpometacarpal (CMC) joint of thumb 11/26/2023   Osteoarthritis of right knee 06/04/2023   Osteoporosis 2004   Osteopenia   Pain in joint of left knee 05/14/2023   Pain of right thumb 11/26/2023   Postoperative nausea 08/07/2023   Raynaud's disease    Sjogren syndrome, unspecified 06/26/2017   Sjogren's disease    Swelling of joint of left wrist 11/26/2023   Tear of meniscus of knee 05/16/2023   TMJ (dislocation of temporomandibular  joint)     Past Surgical History:  Procedure Laterality Date   ABDOMINAL HYSTERECTOMY     BELPHAROPTOSIS REPAIR Bilateral 07/2016   BLADDER SURGERY     BURCH PROCEDURE     CARPAL TUNNEL RELEASE Bilateral    COLON RESECTION  10/2021   REPLACEMENT TOTAL KNEE Right 07/31/2023    Prior to Admission medications   Medication  Sig Start Date End Date Taking? Authorizing Provider  acetaminophen  (TYLENOL ) 650 MG CR tablet Tylenol  Arthritis Pain 650 mg tablet,extended release  Take 1 tablet twice a day by oral route. 06/15/20   [provider]  Cholecalciferol (VITAMIN D3) 50 MCG (2000 UT) CHEW Chew by mouth.    [provider]  clonazePAM  (KLONOPIN ) 1 MG tablet TAKE 1 TABLET BY MOUTH AT  BEDTIME 04/28/24   Simmons-Robinson, Makiera, MD  desonide (DESOWEN) 0.05 % cream Apply topically 2 (two) times daily. Patient taking differently: Apply topically as needed.    [provider]  diclofenac Sodium (VOLTAREN) 1 % GEL diclofenac 1 % topical gel  APPLY 2 GRAM TO THE AFFECTED AREA(S) BY TOPICAL ROUTE 4 TIMES PER DAY 10/07/17   [provider]  fluticasone  (FLONASE ) 50 MCG/ACT nasal spray Place into both nostrils daily.    [provider]  hydrochlorothiazide  (HYDRODIURIL ) 25 MG tablet TAKE 1 TABLET BY MOUTH DAILY 12/03/23   Simmons-Robinson, Rockie, MD  hydroxychloroquine  (PLAQUENIL ) 200 MG tablet Take 1 tablet (200 mg total) by mouth daily. 07/07/24   Rice, Lonni ORN, MD  Light Mineral Oil-Mineral Oil 0.5-0.5 % EMUL Retaine MGD (PF) 0.5 %-0.5 % eye drops in a dropperette  As Needed    [provider]  magic mouthwash SOLN Take 5 mLs by mouth as needed for mouth pain. Suspension contains equal amounts of Maalox Extra Strength, nystatin, and diphenhydramine.    [provider]  meloxicam  (MOBIC ) 7.5 MG tablet Take 1 tablet (7.5 mg total) by mouth daily. 07/07/24   Jeannetta Lonni ORN, MD  Menthol, Topical Analgesic, 6 % GEL Apply topically.    [provider]  metFORMIN  (GLUCOPHAGE -XR) 500 MG 24 hr tablet Take 1 tablet (500 mg total) by mouth daily with breakfast. 07/13/24   Simmons-Robinson, Rockie, MD  metoprolol succinate (TOPROL-XL) 50 MG 24 hr tablet metoprolol succinate ER 50 mg tablet,extended release 24 hr  TAKE 1 TABLET BY MOUTH AT  BEDTIME 06/29/20    [provider]  pilocarpine  (SALAGEN ) 5 MG tablet Take 1 tablet (5 mg total) by mouth at bedtime. 07/07/24   Rice, Lonni ORN, MD  rosuvastatin  (CRESTOR ) 10 MG tablet Take 1 tablet (10 mg total) by mouth daily. 12/25/23   Simmons-Robinson, Rockie, MD  traMADol  (ULTRAM ) 50 MG tablet Take 1 tablet (50 mg total) by mouth every 12 (twelve) hours as needed. 06/14/24   Rice, Lonni ORN, MD    Family History  Problem Relation Age of Onset   Stroke Mother    Hypertension Mother    Breast cancer Mother    Cancer Mother    Arthritis Mother    Stroke Father    Heart failure Father    Heart failure Brother    Heart failure Brother    Heart failure Brother    Heart failure Brother    Cancer Maternal Grandmother    Cancer Maternal Grandfather      Social History   Tobacco Use   Smoking status: Never    Passive exposure: Past   Smokeless tobacco: Never  Vaping Use  Vaping status: Never Used  Substance Use Topics   Alcohol use: Never   Drug use: Never    Allergies as of 09/17/2024 - Review Complete 09/17/2024  Allergen Reaction Noted   Zolpidem Other (See Comments) 03/27/2022   Baclofen Other (See Comments) 08/06/2022   Levofloxacin Nausea And Vomiting 03/27/2022   Orphenadrine Other (See Comments) 08/06/2022   Pregabalin Other (See Comments) 08/06/2022   Tizanidine Other (See Comments) 08/06/2022   Trazodone Other (See Comments) 07/02/2022   Trazodone and nefazodone  07/02/2022   Celecoxib Diarrhea and Other (See Comments) 07/31/2023    Review of Systems:    All systems reviewed and negative except where noted in HPI.   Physical Exam:  BP 122/70   Pulse 65   Ht 5' 1 (1.549 m)   Wt 149 lb 12.8 oz (67.9 kg)   BMI 28.30 kg/m  No LMP recorded. Patient is postmenopausal.  General:   Alert,  Well-developed, well-nourished, pleasant and cooperative in NAD Lungs:  Respirations even and unlabored.  Clear throughout to auscultation.   No wheezes, crackles, or  rhonchi. No acute distress. Heart:  Regular rate and rhythm; no murmurs, clicks, rubs, or gallops. Abdomen:  Normal bowel sounds.  No bruits.  Soft, and non-distended without masses, hepatosplenomegaly or hernias noted.  Mild Generalized Tenderness throughout.  No guarding or rebound tenderness.    Neurologic:  Alert and oriented x3;  grossly normal neurologically. Psych:  Alert and cooperative. Normal mood and affect.   Imaging Studies: No results found.  Labs: CBC    Component Value Date/Time   WBC 8.6 01/13/2024 0857   RBC 4.87 01/13/2024 0857   HGB 14.6 01/13/2024 0857   HCT 43.6 01/13/2024 0857   PLT 329 01/13/2024 0857   MCV 90 01/13/2024 0857    CMP     Component Value Date/Time   NA 144 01/13/2024 0857   K 4.7 01/13/2024 0857   CL 103 01/13/2024 0857   CO2 24 01/13/2024 0857   GLUCOSE 91 01/13/2024 0857   GLUCOSE 89 03/16/2022 0058   BUN 18 01/13/2024 0857   CREATININE 0.91 01/13/2024 0857   CALCIUM  10.2 01/13/2024 0857   PROT 6.6 01/13/2024 0857   ALBUMIN 4.2 01/13/2024 0857   AST 12 01/13/2024 0857   ALT 12 01/13/2024 0857   ALKPHOS 66 01/13/2024 0857   BILITOT 0.5 01/13/2024 0857   GFRNONAA >60 03/16/2022 0058    Assessment and Plan:   Grace Wheeler is a 78 y.o. y/o female has been referred for: Assessment & Plan 1.  Constipation with abdominal bloating and gas.  Most likely due to irritable bowel syndrome.  No abdominal pain or rectal bleeding.  She tried and failed Miralax which caused worsening gas and bloating. - Prescribed Senokot S, two tablets once daily at bedtime, may increase to two tablets twice daily if needed. - Monitor bowel movements and adjust Senokot S dosage as needed. - If Senokot does not work, then try Linzess, Amitiza, Trulance, or Ibsrela.  2.  History of diverticulitis, status post sigmoid colectomy Recurrent diverticulitis led to sigmoid colectomy in December 2022 in [place] . Post-surgical bloating and gas, no pain. Tenderness  likely from scar tissue.  3.  History of colon polyps (tubular adenoma) Tubular adenoma found in December 2022. Previous colonoscopies showed polyps. No family history of colon cancer.  - I am requesting previous colonoscopy reports for review. - Scheduling Colonoscopy with Dr. Albertus per patient request. I discussed risks of colonoscopy with patient to include  risk of bleeding, colon perforation, and risk of sedation.  Patient expressed understanding and agrees to proceed with colonoscopy.    Follow up PRN based on colonoscopy results and GI symptoms.  Ellouise Console, PA-C

## 2024-09-17 ENCOUNTER — Encounter: Payer: Self-pay | Admitting: Family Medicine

## 2024-09-17 ENCOUNTER — Ambulatory Visit: Admitting: Physician Assistant

## 2024-09-17 ENCOUNTER — Other Ambulatory Visit (INDEPENDENT_AMBULATORY_CARE_PROVIDER_SITE_OTHER): Admitting: Family Medicine

## 2024-09-17 VITALS — BP 122/70 | HR 65 | Ht 61.0 in | Wt 149.8 lb

## 2024-09-17 DIAGNOSIS — Z860100 Personal history of colon polyps, unspecified: Secondary | ICD-10-CM | POA: Diagnosis not present

## 2024-09-17 DIAGNOSIS — K5904 Chronic idiopathic constipation: Secondary | ICD-10-CM

## 2024-09-17 DIAGNOSIS — Z8719 Personal history of other diseases of the digestive system: Secondary | ICD-10-CM

## 2024-09-17 DIAGNOSIS — R140 Abdominal distension (gaseous): Secondary | ICD-10-CM

## 2024-09-17 DIAGNOSIS — F5101 Primary insomnia: Secondary | ICD-10-CM | POA: Diagnosis not present

## 2024-09-17 MED ORDER — CLONAZEPAM 1 MG PO TABS: 0.5000 mg | ORAL_TABLET | Freq: Every day | ORAL | Status: DC

## 2024-09-17 MED ORDER — NA SULFATE-K SULFATE-MG SULF 17.5-3.13-1.6 GM/177ML PO SOLN: 1.0000 | Freq: Once | ORAL | 0 refills | Status: AC

## 2024-09-17 MED ORDER — SENNOSIDES-DOCUSATE SODIUM 8.6-50 MG PO TABS: 2.0000 | ORAL_TABLET | Freq: Every day | ORAL | 11 refills | Status: AC

## 2024-09-17 MED ORDER — LORAZEPAM 1 MG PO TABS: ORAL_TABLET | ORAL | 0 refills | Status: DC

## 2024-09-17 NOTE — Telephone Encounter (Signed)
 Please see the MyChart message reply(ies) for my assessment and plan.    This patient gave consent for this Medical Advice Message and is aware that it may result in a bill to [employer], as well as the possibility of receiving a bill for a co-payment or deductible. They are an established patient, but are not seeking medical advice exclusively about a problem treated during an in person or video visit in the last seven days. I did not recommend an in person or video visit within seven days of my reply.   Cross taper for Lorazepam and Klonopin  for insomnia    I spent a total of 15 minutes cumulative time within 7 days through [employer].  Rockie Agent, MD

## 2024-09-17 NOTE — Telephone Encounter (Signed)
 Please see the MyChart message reply(ies) for my assessment and plan.    This patient gave consent for this Medical Advice Message and is aware that it may result in a bill to [employer], as well as the possibility of receiving a bill for a co-payment or deductible. They are an established patient, but are not seeking medical advice exclusively about a problem treated during an in person or video visit in the last seven days. I did not recommend an in person or video visit within seven days of my reply.    I spent a total of 15 minutes cumulative time within 7 days through [employer].  Mimi Alt, MD

## 2024-09-17 NOTE — Patient Instructions (Signed)
 We have sent the following medications to your pharmacy for you to pick up at your convenience: Senokot S twice ay bedtime   You have been scheduled for a Colonoscopy. Please follow written instructions given to you at your visit today.   If you use inhalers (even only as needed), please bring them with you on the day of your procedure.  DO NOT TAKE 7 DAYS PRIOR TO TEST- Trulicity (dulaglutide) Ozempic, Wegovy (semaglutide) Mounjaro (tirzepatide) Bydureon Bcise (exanatide extended release)  DO NOT TAKE 1 DAY PRIOR TO YOUR TEST Rybelsus (semaglutide) Adlyxin (lixisenatide) Victoza (liraglutide) Byetta (exanatide) ___________________________________________________________________________  Please follow up sooner if symptoms increase or worsen   Due to recent changes in healthcare laws, you may see the results of your imaging and laboratory studies on MyChart before your provider has had a chance to review them.  We understand that in some cases there may be results that are confusing or concerning to you. Not all laboratory results come back in the same time frame and the provider may be waiting for multiple results in order to interpret others.  Please give us  48 hours in order for your provider to thoroughly review all the results before contacting the office for clarification of your results.   Thank you for trusting me with your gastrointestinal care!   Ellouise Console, PA-C _______________________________________________________  If your blood pressure at your visit was 140/90 or greater, please contact your primary care physician to follow up on this.  _______________________________________________________  If you are age 22 or older, your body mass index should be between 23-30. Your Body mass index is 28.3 kg/m. If this is out of the aforementioned range listed, please consider follow up with your Primary Care Provider.  If you are age 96 or younger, your body mass index  should be between 19-25. Your Body mass index is 28.3 kg/m. If this is out of the aformentioned range listed, please consider follow up with your Primary Care Provider.   ________________________________________________________  The [facility] providers would like to encourage you to use MYCHART to communicate with providers for non-urgent requests or questions.  Due to long hold times on the telephone, sending your provider a message by [facility] may be a faster and more efficient way to get a response.  Please allow 48 business hours for a response.  Please remember that this is for non-urgent requests.  _______________________________________________________

## 2024-09-19 ENCOUNTER — Encounter: Payer: Self-pay | Admitting: Physician Assistant

## 2024-09-20 ENCOUNTER — Encounter: Payer: Self-pay | Admitting: Internal Medicine

## 2024-09-27 NOTE — Progress Notes (Signed)
 "  Office Visit Note  Patient: Grace Wheeler             Date of Birth: 1946/07/08           MRN: [id number]             PCP: Sharma Coyer, MD Referring: Sharma Coyer, MD Visit Date: 10/06/2024   Subjective:  Medication Management (Pilocarpine  works for the first part of the night and then she wakes up the second half of the evening and her mouth is really dry. )  Discussed the use of AI scribe software for clinical note transcription with the patient, who gave verbal consent to proceed.  History of Present Illness   Grace Wheeler is a 78 y.o. female here for follow up with osteoarthritis and Sjogren's syndrome who presents with joint pain and dryness symptoms.  She has been experiencing persistent mouth ulcers for approximately 20 to 25 years, which are now constant with new ones appearing before the previous ones heal. She describes having up to three ulcers at a time, with the current one located on the lower part of her tongue, causing pain in her jaw and ear. She has previously used Personal Assistant for symptom relief, which was first prescribed over 25 years ago.  She experiences dry mouth, for which she takes pilocarpine . The medication helps produce saliva early in the night, but its effects wear off after about three and a half hours, leading to dry mouth and frequent water intake, disrupting her sleep. No significant side effects from pilocarpine , except for some initial drooling, which has since subsided.  Her past medical history includes pseudogout, for which she was treated with colchicine  last year following knee surgery. She recalls experiencing diarrhea as a side effect when taking two pills a day, which improved when the dose was reduced to one pill a day. She reports having had a torn meniscus in both knees and has experienced ongoing knee problems.  She has been dealing with neck pain, which she attributes to decorating Christmas trees, and describes it  as 'creaking' and painful when moving her neck.       Previous HPI 07/07/2024 Grace Wheeler is a 78 year old female with osteoarthritis and Sjogren's syndrome who presents with joint pain and dryness symptoms.   She has been experiencing joint pain and stiffness, particularly in the knees and shoulders. She stopped taking gabapentin  and started meloxicam  once daily, but notes no significant improvement in symptoms. Her knee is wrapped, and physical therapy has taped it. She experiences significant shoulder pain, especially when lifting her arm, and describes the pain as originating from the joint area. She has a history of bursitis in the shoulder and has received injections in the past, though the most recent injection was less effective.   She experiences wrist pain, especially when lifting objects, and describes the pain as radiating around the wrist.   She reports persistent eye dryness, using Riton multiple times a day and ointment at night. She has tried various prescription treatments, including hydroxyzine, Xiidra, and Restasis, but experienced side effects or insurance coverage issues. She has reverted to using Riton due to these challenges.   She experiences significant mouth dryness, particularly at night, requiring her to keep water by her bed. She previously took pilocarpine  but discontinued it due to cost. She recalls taking it while living in [place]  about seven to eight years ago and still has the bottle at home. She does not recall the dosage but  mentions it was effective at the time.         Previous HPI 06/14/24 Grace Wheeler is a 78 year old female with Sjogren's syndrome, osteoarthritis, and fibromyalgia who presents for management of her chronic pain and joint issues. She is accompanied by her husband, Zachary. She was previously seen by Dr. Tobie for rheumatology care.   She experiences significant dryness due to Sjogren's syndrome, affecting her eyes, mouth, hair, skin, and  nails. She has been on hydroxychloroquine  200 mg daily for about 25 years without consistent noticeable improvement. She uses Retain eye drops during the day and ointment at night, having tried Restasis and Xiidra with less effectiveness or side effects. For dry mouth, she drinks water and uses Magic Mouthwash and Oxifresh for mouth sores.   A knee replacement nearly a year ago did not resolve her pain. She experiences significant pain and stiffness, especially after physical therapy. She has not had fluid removed from the knee but has had steroid injections in both knees and ankles without relief. 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   She experiences chronic pain from fibromyalgia, described as nerve-related with heightened sensitivity. Gabapentin  100 mg twice daily for about a year has not provided significant relief. She takes meloxicam  7.5 mg every other day due to gastrointestinal side effects and Tylenol  Arthritis 650 mg, two to four times daily, for pain management. She previously experienced migraines with fibromyalgia but not currently.   She uses a cane for mobility, especially outside the home, due to pain and instability from her knee and ankle issues. She has a history of meniscus tears in both knees, exacerbated by everyday activities. 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   Her sleep is poor, often taking clonazepam  1 mg before bed, but she struggles with falling asleep due to racing thoughts and wakes frequently. She manages household tasks and finances but sometimes questions her concentration and memory.   She has a history of gout and neuropathy, contributing to her overall pain and discomfort. She has not had recent x-rays of her hands or back.    Review of Systems  Constitutional:  Positive for fatigue.  HENT:  Positive for mouth sores and mouth dryness.   Eyes:  Positive for dryness.  Respiratory:  Negative for shortness of  breath.   Cardiovascular:  Positive for chest pain and palpitations.  Gastrointestinal:  Negative for blood in stool, constipation and diarrhea.  Endocrine: Negative for increased urination.  Genitourinary:  Positive for involuntary urination.  Musculoskeletal:  Positive for joint pain, gait problem, joint pain, joint swelling, myalgias, muscle weakness, muscle tenderness and myalgias. Negative for morning stiffness.  Skin:  Positive for sensitivity to sunlight. Negative for color change, rash and hair loss.  Allergic/Immunologic: Negative for susceptible to infections.  Neurological:  Positive for headaches. Negative for dizziness.  Hematological:  Negative for swollen glands.  Psychiatric/Behavioral:  Positive for sleep disturbance. Negative for depressed mood. The patient is nervous/anxious.     PMFS History:  Patient Active Problem List   Diagnosis Date Noted   Recurrent oral ulcers 10/06/2024   Encounter for Medicare annual wellness exam 01/21/2024   Cervical vertebral fusion 12/07/2023   Cervical myelopathy (HCC) 12/07/2023   Healthcare maintenance 06/25/2023   Overweight (BMI 25.0-29.9) 06/25/2023   Stress incontinence 12/04/2022   Neuropathy 12/04/2022   Dyslipidemia 08/04/2022   Primary hypertension 07/02/2022   Fibromyalgia 07/02/2022   Aortic atherosclerosis 07/02/2022   Raynaud's  disease without gangrene 07/02/2022   Seasonal allergic rhinitis due to pollen 03/27/2022   NAFLD (nonalcoholic fatty liver disease) 96/75/7977   Chronic insomnia 09/30/2020   Generalized osteoarthritis of multiple sites 10/07/2017   Sjogren's syndrome 06/26/2017   Osteopenia 01/07/2017    Past Medical History:  Diagnosis Date   Allergy 2015   Seasonal   Anxiety 1989   Never relaxed   Arthritis    Autoimmune disease    multiple autoimmune system problems   Basal cell carcinoma 10/23/2022   Left upper arm. Nodular, infiltrative type. EDC   Blood transfusion without reported diagnosis     Degenerative joint disease involving multiple joints on both sides of body 10/07/2017   Fibromyalgia    GERD (gastroesophageal reflux disease)    H/O partial resection of colon 02/04/2024   History of appendectomy 02/04/2024   History of decompression of median nerve 02/04/2024   History of hysterectomy 02/04/2024   History of total knee arthroplasty 08/11/2023   Hyperlipidemia 02/04/2024   Hypertension    Long-term use of immunosuppressant medication 08/06/2022   Lumbar spondylitis    Lumbar spondylosis 08/06/2022   Neoplasm of rectum 02/04/2024   Osteoarthritis of carpometacarpal (CMC) joint of thumb 11/26/2023   Osteoarthritis of right knee 06/04/2023   Osteoporosis 2004   Osteopenia   Pain in joint of left knee 05/14/2023   Pain of right thumb 11/26/2023   Postoperative nausea 08/07/2023   Raynaud's disease    Sjogren syndrome, unspecified 06/26/2017   Sjogren's disease    Swelling of joint of left wrist 11/26/2023   Tear of meniscus of knee 05/16/2023   TMJ (dislocation of temporomandibular joint)     Family History  Problem Relation Age of Onset   Stroke Mother    Hypertension Mother    Breast cancer Mother    Cancer Mother    Arthritis Mother    Stroke Father    Heart failure Father    Heart failure Brother    Heart failure Brother    Heart failure Brother    Heart failure Brother    Cancer Maternal Grandmother    Cancer Maternal Grandfather    Past Surgical History:  Procedure Laterality Date   ABDOMINAL HYSTERECTOMY     APPENDECTOMY  1979   BELPHAROPTOSIS REPAIR Bilateral 07/2016   BLADDER SURGERY     BURCH PROCEDURE     CARPAL TUNNEL RELEASE Bilateral    COLON RESECTION  10/2021   COLON SURGERY  2023   Colon resection   EYE SURGERY  1982   JOINT REPLACEMENT  2024   Knee replacement   REPLACEMENT TOTAL KNEE Right 07/31/2023   Social History   Social History Narrative   Not on file   Immunization History  Administered Date(s) Administered    Fluzone Influenza virus vaccine,trivalent (IIV3), split virus 08/04/2014   INFLUENZA, HIGH DOSE SEASONAL PF 08/20/2019, 07/13/2024   Influenza,inj,Quad PF,6+ Mos 09/23/2014, 07/17/2015, 09/16/2016   Influenza-Unspecified 07/18/2022   Moderna Sars-Covid-2 Vaccination 12/14/2019, 01/11/2020   PNEUMOCOCCAL CONJUGATE-20 08/11/2024   Pneumococcal Polysaccharide-23 04/07/2019   Zoster Recombinant(Shingrix) 07/18/2022, 09/18/2022     Objective: Vital Signs: BP 125/69   Pulse 69   Temp 97.6 F (36.4 C)   Resp 13   Ht 5' (1.524 m)   Wt 148 lb 12.8 oz (67.5 kg)   BMI 29.06 kg/m    Physical Exam Eyes:     Conjunctiva/sclera: Conjunctivae normal.  Cardiovascular:     Rate and Rhythm: Normal rate and  regular rhythm.  Pulmonary:     Effort: Pulmonary effort is normal.     Breath sounds: Normal breath sounds.  Skin:    General: Skin is warm and dry.     Comments: Few superficial tortuous varicosities  Neurological:     Mental Status: She is alert.  Psychiatric:        Mood and Affect: Mood normal.      Musculoskeletal Exam:  Neck stiffness increased muscle tone paraspinal muscles Increased thoracic kyphosis with Dowager's hump, diffuse tenderness to pressure Pain in shoulder on arm movement test and resisted arm movement. Full range of motion in shoulder without restriction Elbows full ROM no tenderness or swelling Mild pain in wrist on palpation.  Fingers extensive PIP and DIP heberdon's nodes b/l, lateral deviations in 2nd DIPs, left 1st CMC joint squaring Right knee postsurgical changes and limited ROM  Investigation: No additional findings.  Imaging: No results found.  Recent Labs: Lab Results  Component Value Date   WBC 8.6 01/13/2024   HGB 14.6 01/13/2024   PLT 329 01/13/2024   NA 144 01/13/2024   K 4.7 01/13/2024   CL 103 01/13/2024   CO2 24 01/13/2024   GLUCOSE 91 01/13/2024   BUN 18 01/13/2024   CREATININE 0.91 01/13/2024   BILITOT 0.5 01/13/2024    ALKPHOS 66 01/13/2024   AST 12 01/13/2024   ALT 12 01/13/2024   PROT 6.6 01/13/2024   ALBUMIN 4.2 01/13/2024   CALCIUM  10.2 01/13/2024    Speciality Comments: No specialty comments available.  Procedures:  No procedures performed Allergies: Zolpidem, Baclofen, Celecoxib, Levofloxacin, Orphenadrine, Pregabalin, Tizanidine, and Trazodone and nefazodone   Assessment / Plan:     Visit Diagnoses: Sjogren's syndrome, with unspecified organ involvement  Recurrent oral ulcers - Plan: pilocarpine  (SALAGEN ) 7.5 MG tablet, hydroxychloroquine  (PLAQUENIL ) 200 MG tablet Chronic dry mouth with recurrent oral aphthae for 20-25 years. Pilocarpine  provides temporary relief. Differential includes aphthous ulcers, herpes virus reactivation, and dry mouth-related ulcers. Colchicine  considered for inflammatory causes, with diarrhea as a side effect. Long-term use may require monitoring of blood counts and liver enzymes. - Pilocarpine  to 7.5 mg once daily. - Prescribed colchicine  0.6 mg daily for mouth ulcers. - Provided neck stretching exercises.  Chronic right shoulder pain - S/P R Subacromial bursa 07/07/2024  Generalized osteoarthritis - Plan: meloxicam  (MOBIC ) 7.5 MG tablet Chronic knee pain with arthritis and chondrocalcinosis. Previous knee replacement with poor outcome. Meniscus tears in both knees. She is not willing to undergo further knee replacement surgery. - Meloxicam  7.5 m PO daily  Cervical spondylosis Degenerative changes in the cervical spine with facet joint arthritis. Symptoms include neck pain and clicking sounds, exacerbated by certain movements. - Provided neck stretching exercises.        Orders: No orders of the defined types were placed in this encounter.  Meds ordered this encounter  Medications   meloxicam  (MOBIC ) 7.5 MG tablet    Sig: Take 1 tablet (7.5 mg total) by mouth daily.    Dispense:  90 tablet    Refill:  0   pilocarpine  (SALAGEN ) 7.5 MG tablet    Sig: Take 1  tablet (7.5 mg total) by mouth at bedtime.    Dispense:  90 tablet    Refill:  0   colchicine  0.6 MG tablet    Sig: Take 1 tablet (0.6 mg total) by mouth daily as needed.    Dispense:  30 tablet    Refill:  2   hydroxychloroquine  (PLAQUENIL ) 200  MG tablet    Sig: Take 1 tablet (200 mg total) by mouth daily.    Dispense:  90 tablet    Refill:  0     Follow-Up Instructions: No follow-ups on file.   Lonni LELON Ester, MD  Note - This record has been created using [employer].  Chart creation errors have been sought, but may not always  have been located. Such creation errors do not reflect on  the standard of medical care. "

## 2024-09-28 ENCOUNTER — Encounter: Payer: Self-pay | Admitting: Internal Medicine

## 2024-10-05 ENCOUNTER — Telehealth: Payer: Self-pay | Admitting: Physician Assistant

## 2024-10-05 NOTE — Telephone Encounter (Signed)
GI records

## 2024-10-06 ENCOUNTER — Ambulatory Visit: Attending: Internal Medicine | Admitting: Internal Medicine

## 2024-10-06 ENCOUNTER — Encounter: Payer: Self-pay | Admitting: Internal Medicine

## 2024-10-06 VITALS — BP 125/69 | HR 69 | Temp 97.6°F | Resp 13 | Ht 60.0 in | Wt 148.8 lb

## 2024-10-06 DIAGNOSIS — M159 Polyosteoarthritis, unspecified: Secondary | ICD-10-CM

## 2024-10-06 DIAGNOSIS — G8929 Other chronic pain: Secondary | ICD-10-CM | POA: Diagnosis not present

## 2024-10-06 DIAGNOSIS — K1379 Other lesions of oral mucosa: Secondary | ICD-10-CM | POA: Diagnosis not present

## 2024-10-06 DIAGNOSIS — M25511 Pain in right shoulder: Secondary | ICD-10-CM | POA: Diagnosis not present

## 2024-10-06 DIAGNOSIS — M3500 Sicca syndrome, unspecified: Secondary | ICD-10-CM

## 2024-10-06 MED ORDER — PILOCARPINE HCL 7.5 MG PO TABS: 7.5000 mg | ORAL_TABLET | Freq: Every day | ORAL | 0 refills | Status: AC

## 2024-10-06 MED ORDER — MELOXICAM 7.5 MG PO TABS: 7.5000 mg | ORAL_TABLET | Freq: Every day | ORAL | 0 refills | Status: AC

## 2024-10-06 MED ORDER — HYDROXYCHLOROQUINE SULFATE 200 MG PO TABS: 200.0000 mg | ORAL_TABLET | Freq: Every day | ORAL | 0 refills | Status: AC

## 2024-10-06 MED ORDER — COLCHICINE 0.6 MG PO TABS: 0.6000 mg | ORAL_TABLET | Freq: Every day | ORAL | 2 refills | Status: DC | PRN

## 2024-10-06 NOTE — Patient Instructions (Signed)
 Neck Exercises Ask your health care provider which exercises are safe for you. Do exercises exactly as told by your health care provider and adjust them as directed. It is normal to feel mild stretching, pulling, tightness, or discomfort as you do these exercises. Stop right away if you feel sudden pain or your pain gets worse. Do not begin these exercises until told by your health care provider. Neck exercises can be important for many reasons. They can improve strength and maintain flexibility in your neck, which will help your upper back and prevent neck pain. Stretching exercises Rotation neck stretching  Sit in a chair or stand up. Place your feet flat on the floor, shoulder-width apart. Slowly turn your head (rotate) to the right until a slight stretch is felt. Turn it all the way to the right so you can look over your right shoulder. Do not tilt or tip your head. Hold this position for 10-30 seconds. Slowly turn your head (rotate) to the left until a slight stretch is felt. Turn it all the way to the left so you can look over your left shoulder. Do not tilt or tip your head. Hold this position for 10-30 seconds. Repeat __________ times. Complete this exercise __________ times a day. Neck retraction  Sit in a sturdy chair or stand up. Look straight ahead. Do not bend your neck. Use your fingers to push your chin backward (retraction). Do not bend your neck for this movement. Continue to face straight ahead. If you are doing the exercise properly, you will feel a slight sensation in your throat and a stretch at the back of your neck. Hold the stretch for 1-2 seconds. Repeat __________ times. Complete this exercise __________ times a day. Strengthening exercises Neck press  Lie on your back on a firm bed or on the floor with a pillow under your head. Use your neck muscles to push your head down on the pillow and straighten your spine. Hold the position as well as you can. Keep your head  facing up (in a neutral position) and your chin tucked. Slowly count to 5 while holding this position. Repeat __________ times. Complete this exercise __________ times a day. Isometrics These are exercises in which you strengthen the muscles in your neck while keeping your neck still (isometrics). Sit in a supportive chair and place your hand on your forehead. Keep your head and face facing straight ahead. Do not flex or extend your neck while doing isometrics. Push forward with your head and neck while pushing back with your hand. Hold for 10 seconds. Do the sequence again, this time putting your hand against the back of your head. Use your head and neck to push backward against the hand pressure. Finally, do the same exercise on either side of your head, pushing sideways against the pressure of your hand. Repeat __________ times. Complete this exercise __________ times a day. Prone head lifts  Lie face-down (prone position), resting on your elbows so that your chest and upper back are raised. Start with your head facing downward, near your chest. Position your chin either on or near your chest. Slowly lift your head upward. Lift until you are looking straight ahead. Then continue lifting your head as far back as you can comfortably stretch. Hold your head up for 5 seconds. Then slowly lower it to your starting position. Repeat __________ times. Complete this exercise __________ times a day. Supine head lifts  Lie on your back (supine position), bending your knees  to point to the ceiling and keeping your feet flat on the floor. Lift your head slowly off the floor, raising your chin toward your chest. Hold for 5 seconds. Repeat __________ times. Complete this exercise __________ times a day. Scapular retraction  Stand with your arms at your sides. Look straight ahead. Slowly pull both shoulders (scapulae) backward and downward (retraction) until you feel a stretch between your shoulder  blades in your upper back. Hold for 10-30 seconds. Relax and repeat. Repeat __________ times. Complete this exercise __________ times a day. Contact a health care provider if: Your neck pain or discomfort gets worse when you do an exercise. Your neck pain or discomfort does not improve within 2 hours after you exercise. If you have any of these problems, stop exercising right away. Do not do the exercises again unless your health care provider says that you can. Get help right away if: You develop sudden, severe neck pain. If this happens, stop exercising right away. Do not do the exercises again unless your health care provider says that you can. This information is not intended to replace advice given to you by your health care provider. Make sure you discuss any questions you have with your health care provider. Document Revised: 04/17/2021 Document Reviewed: 04/17/2021 Elsevier Patient Education  2024 [employer].

## 2024-10-12 ENCOUNTER — Ambulatory Visit: Admitting: Family Medicine

## 2024-10-12 ENCOUNTER — Encounter: Payer: Self-pay | Admitting: Family Medicine

## 2024-10-12 VITALS — BP 132/63 | HR 68 | Ht 60.0 in | Wt 147.8 lb

## 2024-10-12 DIAGNOSIS — K760 Fatty (change of) liver, not elsewhere classified: Secondary | ICD-10-CM

## 2024-10-12 DIAGNOSIS — E785 Hyperlipidemia, unspecified: Secondary | ICD-10-CM

## 2024-10-12 DIAGNOSIS — M797 Fibromyalgia: Secondary | ICD-10-CM

## 2024-10-12 DIAGNOSIS — I7300 Raynaud's syndrome without gangrene: Secondary | ICD-10-CM

## 2024-10-12 DIAGNOSIS — M8580 Other specified disorders of bone density and structure, unspecified site: Secondary | ICD-10-CM

## 2024-10-12 DIAGNOSIS — M159 Polyosteoarthritis, unspecified: Secondary | ICD-10-CM

## 2024-10-12 DIAGNOSIS — F5104 Psychophysiologic insomnia: Secondary | ICD-10-CM

## 2024-10-12 DIAGNOSIS — I700 Atherosclerosis of aorta: Secondary | ICD-10-CM

## 2024-10-12 DIAGNOSIS — M3500 Sicca syndrome, unspecified: Secondary | ICD-10-CM

## 2024-10-12 DIAGNOSIS — G629 Polyneuropathy, unspecified: Secondary | ICD-10-CM

## 2024-10-12 DIAGNOSIS — E663 Overweight: Secondary | ICD-10-CM

## 2024-10-12 DIAGNOSIS — I10 Essential (primary) hypertension: Secondary | ICD-10-CM

## 2024-10-12 NOTE — Progress Notes (Unsigned)
 Established patient visit   Patient: Grace Wheeler   DOB: 1946-03-08   78 y.o. Female  MRN: [id number] Visit Date: 10/12/2024  Today's healthcare provider: Rockie Agent, MD   Chief Complaint  Patient presents with  . Medical Management of Chronic Issues    Patient is present for weight mgmt f/u, patient took a few doses and decided to stop medication.   Next week will be last week she is tapering off of clonazepam , doing well with taper    Subjective     HPI     Medical Management of Chronic Issues    Additional comments: Patient is present for weight mgmt f/u, patient took a few doses and decided to stop medication.   Next week will be last week she is tapering off of clonazepam , doing well with taper       Last edited by Cherry Chiquita HERO, CMA on 10/12/2024  1:45 PM.       Discussed the use of AI scribe software for clinical note transcription with the patient, who gave verbal consent to proceed.  History of Present Illness      Past Medical History:  Diagnosis Date  . Allergy 2015   Seasonal  . Anxiety 1989   Never relaxed  . Arthritis   . Autoimmune disease    multiple autoimmune system problems  . Basal cell carcinoma 10/23/2022   Left upper arm. Nodular, infiltrative type. EDC  . Blood transfusion without reported diagnosis   . Degenerative joint disease involving multiple joints on both sides of body 10/07/2017  . Fibromyalgia   . GERD (gastroesophageal reflux disease)   . H/O partial resection of colon 02/04/2024  . History of appendectomy 02/04/2024  . History of decompression of median nerve 02/04/2024  . History of hysterectomy 02/04/2024  . History of total knee arthroplasty 08/11/2023  . Hyperlipidemia 02/04/2024  . Hypertension   . Long-term use of immunosuppressant medication 08/06/2022  . Lumbar spondylitis   . Lumbar spondylosis 08/06/2022  . 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 Osteoarthritis of carpometacarpal (CMC) joint  of thumb 11/26/2023  . Osteoarthritis of right knee 06/04/2023  . Osteoporosis 2004   Osteopenia  . Pain in joint of left knee 05/14/2023  . Pain of right thumb 11/26/2023  . Postoperative nausea 08/07/2023  . Raynaud's disease   . Sjogren syndrome, unspecified 06/26/2017  . Sjogren's disease   . Swelling of joint of left wrist 11/26/2023  . Tear of meniscus of knee 05/16/2023  . TMJ (dislocation of temporomandibular joint)     Medications: Outpatient Medications Prior to Visit  Medication Sig  . acetaminophen  (TYLENOL ) 650 MG CR tablet Tylenol  Arthritis Pain 650 mg tablet,extended release  Take 1 tablet twice a day by oral route.  . clonazePAM  (KLONOPIN ) 1 MG tablet Take 0.5 tablets (0.5 mg total) by mouth at bedtime for 14 days.  . colchicine  0.6 MG tablet Take 1 tablet (0.6 mg total) by mouth daily as needed.  . diclofenac Sodium (VOLTAREN) 1 % GEL diclofenac 1 % topical gel  APPLY 2 GRAM TO THE AFFECTED AREA(S) BY TOPICAL ROUTE 4 TIMES PER DAY  . fluticasone  (FLONASE ) 50 MCG/ACT nasal spray Place into both nostrils daily.  . hydrochlorothiazide  (HYDRODIURIL ) 25 MG tablet TAKE 1 TABLET BY MOUTH DAILY  . hydroxychloroquine  (PLAQUENIL ) 200 MG tablet Take 1 tablet (200 mg total) by mouth daily.  . Light Mineral Oil-Mineral Oil 0.5-0.5 % EMUL Retaine MGD (PF) 0.5 %-0.5 % eye drops  in a dropperette  As Needed  . LORazepam  (ATIVAN ) 1 MG tablet Take 0.5 tablets (0.5 mg total) by mouth at bedtime for 14 days, THEN 1 tablet (1 mg total) at bedtime for 14 days, THEN 2 tablets (2 mg total) at bedtime.  . meloxicam  (MOBIC ) 7.5 MG tablet Take 1 tablet (7.5 mg total) by mouth daily.  . metoprolol succinate (TOPROL-XL) 50 MG 24 hr tablet metoprolol succinate ER 50 mg tablet,extended release 24 hr  TAKE 1 TABLET BY MOUTH AT  BEDTIME  . pilocarpine  (SALAGEN ) 7.5 MG tablet Take 1 tablet (7.5 mg total) by mouth at bedtime.  . rosuvastatin  (CRESTOR ) 10 MG tablet Take 1 tablet (10 mg total) by mouth  daily.  SABRA senna-docusate (SENOKOT-S) 8.6-50 MG tablet Take 2 tablets by mouth at bedtime.  . Cholecalciferol (VITAMIN D3) 50 MCG (2000 UT) CHEW Chew by mouth. (Patient not taking: Reported on 10/12/2024)  . magic mouthwash SOLN Take 5 mLs by mouth as needed for mouth pain. Suspension contains equal amounts of Maalox Extra Strength, nystatin, and diphenhydramine. (Patient not taking: Reported on 10/12/2024)   No facility-administered medications prior to visit.    Review of Systems  Last CBC Lab Results  Component Value Date   WBC 8.6 01/13/2024   HGB 14.6 01/13/2024   HCT 43.6 01/13/2024   MCV 90 01/13/2024   MCH 30.0 01/13/2024   RDW 13.7 01/13/2024   PLT 329 01/13/2024   Last metabolic panel Lab Results  Component Value Date   GLUCOSE 91 01/13/2024   NA 144 01/13/2024   K 4.7 01/13/2024   CL 103 01/13/2024   CO2 24 01/13/2024   BUN 18 01/13/2024   CREATININE 0.91 01/13/2024   EGFR 66 01/13/2024   CALCIUM  10.2 01/13/2024   PROT 6.6 01/13/2024   ALBUMIN 4.2 01/13/2024   LABGLOB 2.4 01/13/2024   BILITOT 0.5 01/13/2024   ALKPHOS 66 01/13/2024   AST 12 01/13/2024   ALT 12 01/13/2024   ANIONGAP 8 03/16/2022   Last lipids Lab Results  Component Value Date   CHOL 148 01/13/2024   HDL 71 01/13/2024   LDLCALC 59 01/13/2024   TRIG 102 01/13/2024   CHOLHDL 2.1 01/13/2024   Last hemoglobin A1c Lab Results  Component Value Date   HGBA1C 5.5 01/13/2024   Last thyroid functions Lab Results  Component Value Date   TSH 4.940 (H) 01/13/2024   FREET4 1.44 01/13/2024   Last vitamin D  Lab Results  Component Value Date   VD25OH 39.3 01/13/2024   Last vitamin B12 and Folate Lab Results  Component Value Date   VITAMINB12 261 12/04/2022     {See past labs  Heme  Chem  Endocrine  Serology  Results Review (optional):1}   Objective    BP 132/63 (BP Location: Right Arm, Patient Position: Sitting, Cuff Size: Normal)   Pulse 68   Ht 5' (1.524 m)   Wt 147 lb 12.8 oz  (67 kg)   SpO2 98%   BMI 28.87 kg/m  BP Readings from Last 3 Encounters:  10/12/24 132/63  10/06/24 125/69  09/17/24 122/70   Wt Readings from Last 3 Encounters:  10/12/24 147 lb 12.8 oz (67 kg)  10/06/24 148 lb 12.8 oz (67.5 kg)  09/17/24 149 lb 12.8 oz (67.9 kg)    {See vitals history (optional):1}    Physical Exam  ***  No results found for any visits on 10/12/24.  Assessment & Plan     Problem List Items Addressed This Visit  Overweight (BMI 25.0-29.9) - Primary    Assessment and Plan Assessment & Plan      No follow-ups on file.         Rockie Agent, MD  [facility] [phone] (phone) [phone] (fax)  [facility]

## 2024-10-12 NOTE — Patient Instructions (Signed)
 To keep you healthy, please keep in mind the following health maintenance items that you are due for:   Health Maintenance Due  Topic Date Due   Hepatitis C Screening  Never done   DTaP/Tdap/Td (1 - Tdap) Never done     Best Wishes,   Dr. Lang

## 2024-11-02 ENCOUNTER — Ambulatory Visit: Admitting: Internal Medicine

## 2024-11-02 ENCOUNTER — Encounter: Payer: Self-pay | Admitting: Internal Medicine

## 2024-11-02 VITALS — BP 111/42 | HR 73 | Temp 97.2°F | Resp 13 | Ht 61.0 in | Wt 149.0 lb

## 2024-11-02 DIAGNOSIS — Z1211 Encounter for screening for malignant neoplasm of colon: Secondary | ICD-10-CM | POA: Diagnosis not present

## 2024-11-02 DIAGNOSIS — D123 Benign neoplasm of transverse colon: Secondary | ICD-10-CM | POA: Diagnosis not present

## 2024-11-02 DIAGNOSIS — K5730 Diverticulosis of large intestine without perforation or abscess without bleeding: Secondary | ICD-10-CM | POA: Diagnosis not present

## 2024-11-02 DIAGNOSIS — D122 Benign neoplasm of ascending colon: Secondary | ICD-10-CM

## 2024-11-02 DIAGNOSIS — D120 Benign neoplasm of cecum: Secondary | ICD-10-CM

## 2024-11-02 DIAGNOSIS — K635 Polyp of colon: Secondary | ICD-10-CM

## 2024-11-02 DIAGNOSIS — Z860101 Personal history of adenomatous and serrated colon polyps: Secondary | ICD-10-CM | POA: Diagnosis not present

## 2024-11-02 DIAGNOSIS — Z860100 Personal history of colon polyps, unspecified: Secondary | ICD-10-CM

## 2024-11-02 DIAGNOSIS — K648 Other hemorrhoids: Secondary | ICD-10-CM | POA: Diagnosis not present

## 2024-11-02 MED ORDER — SODIUM CHLORIDE 0.9 % IV SOLN: 500.0000 mL | Freq: Once | INTRAVENOUS | Status: DC

## 2024-11-02 NOTE — Patient Instructions (Signed)

## 2024-11-02 NOTE — Progress Notes (Unsigned)
 Sedate, gd SR, tolerated procedure well, VSS, report to RN

## 2024-11-02 NOTE — Progress Notes (Unsigned)
 Called to room to assist during endoscopic procedure.  Patient ID and intended procedure confirmed with present staff. Received instructions for my participation in the procedure from the performing physician.

## 2024-11-02 NOTE — Progress Notes (Unsigned)
 "   GASTROENTEROLOGY PROCEDURE H&P NOTE   Primary Care Physician: Sharma Coyer, MD    Reason for Procedure:  History of adenomatous colonic polyps, prior complicated diverticulitis status post sigmoid colectomy December 2022 performed in [place]   Plan:    Surveillance colonoscopy  Patient is appropriate for endoscopic procedure(s) in the ambulatory ([facility]) setting.  The nature of the procedure, as well as the risks, benefits, and alternatives were carefully and thoroughly reviewed with the patient. Ample time for discussion and questions allowed.  All questions were answered. The patient understood, was satisfied, and agreed with the plan to proceed.    HPI: Grace Wheeler is a 78 y.o. female who presents for surveillance colonoscopy.  Medical history as below.  Tolerated the prep.  No recent chest pain or shortness of breath.  No abdominal pain today.  Past Medical History:  Diagnosis Date   Allergy 2015   Seasonal   Anxiety 1989   Never relaxed   Arthritis    Autoimmune disease    multiple autoimmune system problems   Basal cell carcinoma 10/23/2022   Left upper arm. Nodular, infiltrative type. 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   Osteoarthritis of carpometacarpal (CMC) joint of thumb 11/26/2023   Osteoarthritis of right knee 06/04/2023   Osteoporosis 2004   Osteopenia   Pain in joint of left  knee 05/14/2023   Pain of right thumb 11/26/2023   Postoperative nausea 08/07/2023   Raynaud's disease    Sjogren syndrome, unspecified 06/26/2017   Sjogren's disease    Swelling of joint of left wrist 11/26/2023   Tear of meniscus of knee 05/16/2023   TMJ (dislocation of temporomandibular joint)     Past Surgical History:  Procedure Laterality Date   ABDOMINAL HYSTERECTOMY     APPENDECTOMY  1979   BELPHAROPTOSIS REPAIR Bilateral 07/2016   BLADDER SURGERY     BURCH PROCEDURE     CARPAL TUNNEL RELEASE Bilateral    COLON RESECTION  10/2021   COLON SURGERY  2023   Colon resection   EYE SURGERY  1982   JOINT REPLACEMENT  2024   Knee replacement   REPLACEMENT TOTAL KNEE Right 07/31/2023    Prior to Admission medications  Medication Sig Start Date End Date Taking? Authorizing Provider  acetaminophen  (TYLENOL ) 650 MG CR tablet Tylenol  Arthritis Pain 650 mg tablet,extended release  Take 1 tablet twice a day by oral route. 06/15/20  Yes [provider]  Cholecalciferol (VITAMIN D3) 50 MCG (2000 UT) CHEW Chew by mouth.   Yes [provider]  diclofenac Sodium (VOLTAREN) 1 % GEL diclofenac 1 % topical gel  APPLY 2 GRAM TO THE AFFECTED AREA(S) BY TOPICAL ROUTE 4 TIMES PER DAY 10/07/17  Yes [provider]  fluticasone  (FLONASE ) 50 MCG/ACT nasal spray Place into both nostrils daily.   Yes [provider]  hydrochlorothiazide  (HYDRODIURIL ) 25 MG tablet TAKE 1 TABLET BY MOUTH DAILY 12/03/23  Yes  Simmons-Robinson, Makiera, MD  hydroxychloroquine  (PLAQUENIL ) 200 MG tablet Take 1 tablet (200 mg total) by mouth daily. 10/06/24  Yes Rice, Lonni ORN, MD  Light Mineral Oil-Mineral Oil 0.5-0.5 % EMUL Retaine MGD (PF) 0.5 %-0.5 % eye drops in a dropperette  As Needed   Yes [provider]  LORazepam  (ATIVAN ) 1 MG tablet Take 0.5 tablets (0.5 mg total) by mouth at bedtime for 14 days, THEN 1 tablet (1 mg total) at bedtime for 14 days, THEN 2 tablets (2 mg  total) at bedtime. 09/17/24 11/14/24 Yes Simmons-Robinson, Makiera, MD  metoprolol succinate (TOPROL-XL) 50 MG 24 hr tablet metoprolol succinate ER 50 mg tablet,extended release 24 hr  TAKE 1 TABLET BY MOUTH AT  BEDTIME 06/29/20  Yes [provider]  pilocarpine  (SALAGEN ) 7.5 MG tablet Take 1 tablet (7.5 mg total) by mouth at bedtime. 10/06/24  Yes Rice, Lonni ORN, MD  rosuvastatin  (CRESTOR ) 10 MG tablet Take 1 tablet (10 mg total) by mouth daily. 12/25/23  Yes Simmons-Robinson, Makiera, MD  clonazePAM  (KLONOPIN ) 1 MG tablet Take 0.5 tablets (0.5 mg total) by mouth at bedtime for 14 days. Patient not taking: Reported on 11/02/2024 09/17/24 10/12/24  Simmons-Robinson, Rockie, MD  colchicine  0.6 MG tablet Take 1 tablet (0.6 mg total) by mouth daily as needed. Patient not taking: Reported on 11/02/2024 10/06/24   Jeannetta Lonni ORN, MD  magic mouthwash SOLN Take 5 mLs by mouth as needed for mouth pain. Suspension contains equal amounts of Maalox Extra Strength, nystatin, and diphenhydramine. Patient not taking: No sig reported    [provider]  meloxicam  (MOBIC ) 7.5 MG tablet Take 1 tablet (7.5 mg total) by mouth daily. 10/06/24   Rice, Lonni ORN, MD  senna-docusate (SENOKOT-S) 8.6-50 MG tablet Take 2 tablets by mouth at bedtime. 09/17/24   [place], PA-C    Current Outpatient Medications  Medication Sig Dispense Refill   acetaminophen  (TYLENOL ) 650 MG CR tablet Tylenol  Arthritis Pain 650 mg tablet,extended release  Take 1 tablet twice a day by oral route.     Cholecalciferol (VITAMIN D3) 50 MCG (2000 UT) CHEW Chew by mouth.     diclofenac Sodium (VOLTAREN) 1 % GEL diclofenac 1 % topical gel  APPLY 2 GRAM TO THE AFFECTED AREA(S) BY TOPICAL ROUTE 4 TIMES PER DAY     fluticasone  (FLONASE ) 50 MCG/ACT nasal spray Place into both nostrils daily.     hydrochlorothiazide  (HYDRODIURIL ) 25 MG tablet TAKE 1 TABLET BY MOUTH DAILY 90 tablet 3   hydroxychloroquine  (PLAQUENIL ) 200  MG tablet Take 1 tablet (200 mg total) by mouth daily. 90 tablet 0   Light Mineral Oil-Mineral Oil 0.5-0.5 % EMUL Retaine MGD (PF) 0.5 %-0.5 % eye drops in a dropperette  As Needed     LORazepam  (ATIVAN ) 1 MG tablet Take 0.5 tablets (0.5 mg total) by mouth at bedtime for 14 days, THEN 1 tablet (1 mg total) at bedtime for 14 days, THEN 2 tablets (2 mg total) at bedtime. 75 tablet 0   metoprolol succinate (TOPROL-XL) 50 MG 24 hr tablet metoprolol succinate ER 50 mg tablet,extended release 24 hr  TAKE 1 TABLET BY MOUTH AT  BEDTIME     pilocarpine  (SALAGEN ) 7.5 MG tablet Take 1 tablet (7.5 mg total) by mouth at bedtime. 90 tablet 0   rosuvastatin  (CRESTOR ) 10 MG tablet Take 1 tablet (10 mg total) by mouth daily. 90 tablet 3   clonazePAM  (KLONOPIN ) 1 MG tablet Take 0.5 tablets (0.5 mg total) by mouth at bedtime  for 14 days. (Patient not taking: Reported on 11/02/2024)     colchicine  0.6 MG tablet Take 1 tablet (0.6 mg total) by mouth daily as needed. (Patient not taking: Reported on 11/02/2024) 30 tablet 2   magic mouthwash SOLN Take 5 mLs by mouth as needed for mouth pain. Suspension contains equal amounts of Maalox Extra Strength, nystatin, and diphenhydramine. (Patient not taking: No sig reported)     meloxicam  (MOBIC ) 7.5 MG tablet Take 1 tablet (7.5 mg total) by mouth daily. 90 tablet 0   senna-docusate (SENOKOT-S) 8.6-50 MG tablet Take 2 tablets by mouth at bedtime. 60 tablet 11   Current Facility-Administered Medications  Medication Dose Route Frequency Provider Last Rate Last Admin   0.9 %  sodium chloride infusion  500 mL Intravenous Once Jennetta Flood, Gordy HERO, MD        Allergies as of 11/02/2024 - Review Complete 11/02/2024  Allergen Reaction Noted   Zolpidem Other (See Comments) 03/27/2022   Baclofen Other (See Comments) 08/06/2022   Celecoxib Diarrhea and Other (See Comments) 07/31/2023   Levofloxacin Nausea And Vomiting 03/27/2022   Orphenadrine Other (See Comments) 08/06/2022   Pregabalin  Other (See Comments) 08/06/2022   Tizanidine Other (See Comments) 08/06/2022   Trazodone and nefazodone Other (See Comments) 07/02/2022    Family History  Problem Relation Age of Onset   Stroke Mother    Hypertension Mother    Breast cancer Mother    Cancer Mother    Arthritis Mother    Stroke Father    Heart failure Father    Heart failure Brother    Heart failure Brother    Heart failure Brother    Heart failure Brother    Cancer Maternal Grandmother    Cancer Maternal Grandfather     Social History   Socioeconomic History   Marital status: Married    Spouse name: Not on file   Number of children: Not on file   Years of education: Not on file   Highest education level: Associate degree: occupational, scientist, product/process development, or vocational program  Occupational History   Not on file  Tobacco Use   Smoking status: Never    Passive exposure: Past   Smokeless tobacco: Never  Vaping Use   Vaping status: Never Used  Substance and Sexual Activity   Alcohol use: Never   Drug use: Never   Sexual activity: Not on file  Other Topics Concern   Not on file  Social History Narrative   Not on file   Social Drivers of Health   Tobacco Use: Low Risk (11/02/2024)   Patient History    Smoking Tobacco Use: Never    Smokeless Tobacco Use: Never    Passive Exposure: Past  Financial Resource Strain: Low Risk (10/08/2024)   Overall Financial Resource Strain (CARDIA)    Difficulty of Paying Living Expenses: Not very hard  Food Insecurity: No Food Insecurity (10/08/2024)   Epic    Worried About Radiation Protection Practitioner of Food in the Last Year: Never true    Ran Out of Food in the Last Year: Never true  Transportation Needs: No Transportation Needs (10/08/2024)   Epic    Lack of Transportation (Medical): No    Lack of Transportation (Non-Medical): No  Physical Activity: Insufficiently Active (10/08/2024)   Exercise Vital Sign    Days of Exercise per Week: 4 days    Minutes of Exercise per Session: 30  min  Stress: Stress Concern Present (10/08/2024)   [employer] of Occupational Health -  Occupational Stress Questionnaire    Feeling of Stress: Rather much  Social Connections: Socially Integrated (10/08/2024)   Social Connection and Isolation Panel    Frequency of Communication with Friends and Family: Three times a week    Frequency of Social Gatherings with Friends and Family: Once a week    Attends Religious Services: More than 4 times per year    Active Member of Clubs or Organizations: Yes    Attends Engineer, Structural: More than 4 times per year    Marital Status: Married  Catering Manager Violence: Not on file  Depression (PHQ2-9): Medium Risk (10/12/2024)   Depression (PHQ2-9)    PHQ-2 Score: 8  Alcohol Screen: Low Risk (08/02/2022)   Alcohol Screen    Last Alcohol Screening Score (AUDIT): 0  Housing: Unknown (10/08/2024)   Epic    Unable to Pay for Housing in the Last Year: No    Number of Times Moved in the Last Year: Not on file    Homeless in the Last Year: No  Utilities: Not At Risk (01/21/2024)   A[employer] Utilities    Threatened with loss of utilities: No  Health Literacy: Adequate Health Literacy (01/21/2024)   B1300 Health Literacy    Frequency of need for help with medical instructions: Never    Physical Exam: Vital signs in last 24 hours: @BP  (!) 146/72   Pulse 76   Temp (!) 97.2 F (36.2 C) (Temporal)   Ht 5' 1 (1.549 m)   Wt 149 lb (67.6 kg)   SpO2 96%   BMI 28.15 kg/m  GEN: NAD EYE: Sclerae anicteric ENT: MMM CV: Non-tachycardic Pulm: CTA b/l GI: Soft, NT/ND NEURO:  Alert & Oriented x 3   Gordy Starch, MD  Gastroenterology  11/02/2024 2:47 PM  "

## 2024-11-02 NOTE — Op Note (Signed)
 [facility] Patient Name: Grace Wheeler Procedure Date: 11/02/2024 2:42 PM MRN: [id number] Endoscopist: Gordy CHRISTELLA Starch , MD, [id number] Age: 78 Referring MD:  Date of Birth: 06/17/1946 Gender: Female Account #: [id number] Procedure:                Colonoscopy Indications:              Surveillance: Personal history of adenomatous                            polyps on last colonoscopy 3 years ago, Last                            colonoscopy: December 2022 (TA x 1) Medicines:                Monitored Anesthesia Care Procedure:                Pre-Anesthesia Assessment:                           - Prior to the procedure, a History and Physical                            was performed, and patient medications and                            allergies were reviewed. The patient's tolerance of                            previous anesthesia was also reviewed. The risks                            and benefits of the procedure and the sedation                            options and risks were discussed with the patient.                            All questions were answered, and informed consent                            was obtained. Prior Anticoagulants: The patient has                            taken no anticoagulant or antiplatelet agents. ASA                            Grade Assessment: II - A patient with mild systemic                            disease. After reviewing the risks and benefits,                            the patient was deemed in satisfactory condition to  undergo the procedure.                           After obtaining informed consent, the colonoscope                            was passed under direct vision. Throughout the                            procedure, the patient's blood pressure, pulse, and                            oxygen saturations were monitored continuously. The                            Olympus Scope SN [id number] was  introduced through the                            anus and advanced to the cecum, identified by                            appendiceal orifice and ileocecal valve. The                            colonoscopy was performed without difficulty. The                            patient tolerated the procedure well. The quality                            of the bowel preparation was good. The ileocecal                            valve, appendiceal orifice, and rectum were                            photographed. Scope In: 3:05:57 PM Scope Out: 3:23:19 PM Scope Withdrawal Time: 0 hours 16 minutes 11 seconds  Total Procedure Duration: 0 hours 17 minutes 22 seconds  Findings:                 The digital rectal exam was normal.                           Four sessile polyps were found in the cecum. The                            polyps were 2 to 5 mm in size. These polyps were                            removed with a cold snare. Resection and retrieval                            were complete.  Four sessile polyps were found in the ascending                            colon. The polyps were 4 to 6 mm in size. These                            polyps were removed with a cold snare. Resection                            and retrieval were complete.                           Five sessile polyps were found in the transverse                            colon. The polyps were 3 to 5 mm in size. These                            polyps were removed with a cold snare. Resection                            and retrieval were complete.                           Multiple small-mouthed diverticula were found in                            the descending colon, hepatic flexure and ascending                            colon.                           There was evidence of a prior end-to-side                            colo-colonic anastomosis in the sigmoid colon. This                             was patent and was characterized by healthy                            appearing mucosa.                           Internal hemorrhoids were found during                            retroflexion. The hemorrhoids were small. Complications:            No immediate complications. Estimated Blood Loss:     Estimated blood loss was minimal. Impression:               - Four 2 to 5 mm polyps in the cecum, removed with  a cold snare. Resected and retrieved.                           - Four 4 to 6 mm polyps in the ascending colon,                            removed with a cold snare. Resected and retrieved.                           - Five 3 to 5 mm polyps in the transverse colon,                            removed with a cold snare. Resected and retrieved.                           - Mild diverticulosis in the descending colon, at                            the hepatic flexure and in the ascending colon.                           - Patent end-to-side colo-colonic anastomosis,                            characterized by healthy appearing mucosa.                           - Small internal hemorrhoids. Recommendation:           - Patient has a contact number available for                            emergencies. The signs and symptoms of potential                            delayed complications were discussed with the                            patient. Return to normal activities tomorrow.                            Written discharge instructions were provided to the                            patient.                           - Resume previous diet.                           - Continue present medications.                           - Await pathology results.                           -  Repeat colonoscopy is recommended for                            surveillance. The colonoscopy date will be                            determined after pathology results from today's                             exam become available for review. Gordy CHRISTELLA Starch, MD 11/02/2024 3:27:20 PM This report has been signed electronically.

## 2024-11-05 ENCOUNTER — Telehealth: Payer: Self-pay

## 2024-11-05 LAB — SURGICAL PATHOLOGY

## 2024-11-05 NOTE — Telephone Encounter (Signed)
" °  Follow up Call-     11/02/2024    2:07 PM  Call back number  Post procedure Call Back phone  # [phone]  Permission to leave phone message Yes    Attempted to call patient regarding follow-up. No answer, VM left.   "

## 2024-11-08 ENCOUNTER — Ambulatory Visit: Payer: Self-pay | Admitting: Internal Medicine

## 2024-11-09 ENCOUNTER — Ambulatory Visit

## 2024-11-09 VITALS — BP 109/72 | HR 73 | Temp 98.1°F | Wt 146.3 lb

## 2024-11-09 DIAGNOSIS — M47812 Spondylosis without myelopathy or radiculopathy, cervical region: Secondary | ICD-10-CM | POA: Diagnosis not present

## 2024-11-09 DIAGNOSIS — M26609 Unspecified temporomandibular joint disorder, unspecified side: Secondary | ICD-10-CM | POA: Diagnosis not present

## 2024-11-09 DIAGNOSIS — M62838 Other muscle spasm: Secondary | ICD-10-CM | POA: Diagnosis not present

## 2024-11-09 MED ORDER — CYCLOBENZAPRINE HCL 5 MG PO TABS: 5.0000 mg | ORAL_TABLET | Freq: Every day | ORAL | 0 refills | Status: AC

## 2024-11-09 NOTE — Progress Notes (Signed)
 "    Acute visit   Patient: Grace Wheeler   DOB: [birth year]   79 y.o. Female  MRN: [id number] PCP: Sharma Coyer, MD   Chief Complaint  Patient presents with   Ear Pain    Left ear for the past couple of weeks has been hurting now pain going down her jaw and her neck Pt has TMJ    Subjective    Discussed the use of AI scribe software for clinical note transcription with the patient, who gave verbal consent to proceed.  History of Present Illness Grace Wheeler is a 79 year old female with fibromyalgia and TMJ who presents with ear pain and neck stiffness.  For the past three weeks, she has experienced persistent pain under her left ear, which initially began with ulcers on the left side of her tongue. The ulcers resolved with the use of magic mouthwash, but the ear pain persisted. The pain has since radiated to her jaw, neck, and shoulder, and she reports significant stiffness and discomfort, particularly in her neck. The pain is constant, occurring both day and night, and has been accompanied by a stiff neck for over a week.  She has a history of fibromyalgia and TMJ, which often cause ear and neck pain. Her current medications include Tylenol  and meloxicam  for arthritis, which have not alleviated her current symptoms. She has not taken any muscle relaxers recently. She has listed allergies to baclofen and tizanidine, which she does not recall taking nor has an allergy to. She has previously taken Flexeril  and methocarbamol without side effects.  She experiences frequent sneezing, nasal congestion at night, and a constant nasal drip. No fever, chills, or significant illness.  She has a history of Sjogren's syndrome, which causes xerostomia, especially at night, and she takes pilocarpine  to manage this. She experiences recurrent oral ulcers that 'come and go.'  She has arthritis in her spine, which causes back pain, and she uses topical treatments like Voltaren gel and Ensembra, as  well as vitality extract oils, for temporary relief. She applies these treatments once daily, particularly at bedtime.   Review of systems as noted in HPI.   Objective    BP 109/72   Pulse 73   Temp 98.1 F (36.7 C) (Oral)   Wt 146 lb 4.8 oz (66.4 kg)   SpO2 96%   BMI 27.64 kg/m  Physical Exam Constitutional:      Appearance: Normal appearance.  HENT:     Head: Normocephalic and atraumatic.     Right Ear: Tympanic membrane, ear canal and external ear normal.     Left Ear: Tympanic membrane, ear canal and external ear normal.     Mouth/Throat:     Mouth: Mucous membranes are moist.  Eyes:     Pupils: Pupils are equal, round, and reactive to light.  Pulmonary:     Effort: Pulmonary effort is normal.  Musculoskeletal:     Cervical back: Tenderness and bony tenderness present.     Comments: TTP along SCM, trapezius, and cervical spine.  Lymphadenopathy:     Cervical: No cervical adenopathy.  Skin:    General: Skin is warm.  Neurological:     General: No focal deficit present.     Mental Status: She is alert.       No results found for any visits on 11/09/24.  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 Pain radiates from ear to neck, along sternocleidomastoid muscle. No evidence of ear infection - Prescribed Flexeril  (cyclobenzaprine ) at night as needed for muscle tension - Advised against daytime use of Flexeril  due to sedative effects. - Continue Tylenol  and meloxicam  for pain management. - Apply Voltaren gel up to four times daily to affected areas. - Use ice to decrease  inflammation.  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 - Continue meloxicam  and Tylenol  for pain management. - Apply Voltaren gel up to four times daily to affected areas. - Use ice to decrease inflammation.   Meds ordered this encounter  Medications   cyclobenzaprine  (FLEXERIL ) 5 MG tablet    Sig: Take 1 tablet (5 mg total) by mouth at bedtime.    Dispense:  30 tablet    Refill:  0     No follow-ups on file.      Isaiah DELENA Pepper, MD  [facility] [phone] (phone) [phone] (fax) "

## 2024-11-16 ENCOUNTER — Telehealth: Payer: Self-pay | Admitting: Family Medicine

## 2024-11-16 NOTE — Telephone Encounter (Signed)
 Lorazepam  last prescribed 09/17/24 with a ramp up for insomnia

## 2024-11-16 NOTE — Telephone Encounter (Signed)
 Copied from CRM [id number]. Topic: Clinical - Medication Refill >> Nov 16, 2024  2:15 PM Joesph B wrote: Medication: lorazepam  2 mg, one at bed time (not on med list)   Has the patient contacted their pharmacy? Yes (Agent: If no, request that the patient contact the pharmacy for the refill. If patient does not wish to contact the pharmacy document the reason why and proceed with request.) (Agent: If yes, when and what did the pharmacy advise?)  This is the patient's preferred pharmacy:  [id number] [address] [facility] - [address] [place] [address] Phone: [phone] Fax: [phone]    Is this the correct pharmacy for this prescription? Yes If no, delete pharmacy and type the correct one.   Has the prescription been filled recently? Yes  Is the patient out of the medication? Yes  Has the patient been seen for an appointment in the last year OR does the patient have an upcoming appointment? Yes  Can we respond through MyChart? Yes  Agent: Please be advised that Rx refills may take up to 3 business days. We ask that you follow-up with your pharmacy.

## 2024-11-17 ENCOUNTER — Other Ambulatory Visit: Payer: Self-pay | Admitting: Family Medicine

## 2024-11-17 DIAGNOSIS — F5104 Psychophysiologic insomnia: Secondary | ICD-10-CM

## 2024-11-17 MED ORDER — LORAZEPAM 2 MG PO TABS: 2.0000 mg | ORAL_TABLET | Freq: Four times a day (QID) | ORAL | 3 refills | Status: AC | PRN

## 2024-11-17 NOTE — Telephone Encounter (Signed)
 Med list updated. Ativan  for insomnia sent to pharmacy

## 2024-11-30 ENCOUNTER — Other Ambulatory Visit: Payer: Self-pay | Admitting: Internal Medicine

## 2024-11-30 DIAGNOSIS — M3500 Sicca syndrome, unspecified: Secondary | ICD-10-CM

## 2024-12-03 ENCOUNTER — Other Ambulatory Visit: Payer: Self-pay

## 2024-12-03 ENCOUNTER — Telehealth: Admitting: Internal Medicine

## 2024-12-03 ENCOUNTER — Encounter: Payer: Self-pay | Admitting: Internal Medicine

## 2024-12-03 DIAGNOSIS — R051 Acute cough: Secondary | ICD-10-CM

## 2024-12-03 DIAGNOSIS — J40 Bronchitis, not specified as acute or chronic: Secondary | ICD-10-CM

## 2024-12-03 DIAGNOSIS — J22 Unspecified acute lower respiratory infection: Secondary | ICD-10-CM

## 2024-12-03 MED ORDER — AMOXICILLIN-POT CLAVULANATE 875-125 MG PO TABS: 1.0000 | ORAL_TABLET | Freq: Two times a day (BID) | ORAL | 0 refills | Status: AC

## 2024-12-03 MED ORDER — BENZONATATE 100 MG PO CAPS: 100.0000 mg | ORAL_CAPSULE | Freq: Two times a day (BID) | ORAL | 0 refills | Status: AC | PRN

## 2024-12-03 NOTE — Progress Notes (Signed)
 Virtual Visit via Video Note  I connected with Grace Wheeler on 12/03/24 at  2:00 PM EST by a video enabled telemedicine application and verified that I am speaking with the correct person using two identifiers.  Location: Patient: Home Provider: [facility]   I discussed the limitations of evaluation and management by telemedicine and the availability of in person appointments. The patient expressed understanding and agreed to proceed.  History of Present Illness:  Discussed the use of AI scribe software for clinical note transcription with the patient, who gave verbal consent to proceed.  History of Present Illness Grace Wheeler is a 79 year old female who presents with a persistent non-productive cough and night sweats.  She reports a 2-week persistent non-productive cough that initially improved but has since worsened, now causing abdominal soreness from coughing and a sensation of something in her lungs she cannot clear. She has nightly drenching night sweats. She denies fever and significant shortness of breath but notes mild wheezing, weakness with walking, and a sore throat she attributes to congestion without nasal discharge. She has ear pain that she believes is from TMJ and no facial sinus pain.  She uses Tylenol  for arthritis and an old hydrocodone cough syrup that helps her cough and of which she has one dose left. She has not taken other OTC cold or flu medications despite having them at home.    Observations/Objective:  General: ill appearing, no acute distress ENT: conjunctiva normal appearing bilaterally  Pulm: dry cough heard via telemedicine Skin: no rashes, cyanosis or abnormal bruising noted Neuro: answers all questions appropriately   Assessment and Plan:  Assessment & Plan Acute respiratory infection Persistent cough with non-productive cough, sore throat, congestion, night sweats, mild wheezing, and weakness. No fever. Ear pain likely due to TMJ. Decision to treat with  antibiotics due to prolonged symptoms and lack of improvement. - Prescribed Augmentin  (amoxicillin /clavulanate) twice daily for five days. - Recommended over-the-counter Mucinex in the morning to help break up mucus, with the option to take two pills if needed, but advised against taking at bedtime. - Advised continuation of nasal spray for congestion. - Instructed to take probiotic if experiencing stomach upset or diarrhea from antibiotics. - Advised to return for evaluation if symptoms worsen or do not improve, with consideration for chest x-ray if necessary.  - amoxicillin -clavulanate (AUGMENTIN ) 875-125 MG tablet; Take 1 tablet by mouth 2 (two) times daily for 5 days.  Dispense: 10 tablet; Refill: 0 - benzonatate  (TESSALON ) 100 MG capsule; Take 1 capsule (100 mg total) by mouth 2 (two) times daily as needed for cough.  Dispense: 20 capsule; Refill: 0   Follow Up Instructions: in person if symptoms worsen or fail to improve    I discussed the assessment and treatment plan with the patient. The patient was provided an opportunity to ask questions and all were answered. The patient agreed with the plan and demonstrated an understanding of the instructions.   The patient was advised to call back or seek an in-person evaluation if the symptoms worsen or if the condition fails to improve as anticipated.  I provided 10 minutes of non-face-to-face time during this encounter.   Sharyle Fischer, DO

## 2024-12-06 ENCOUNTER — Ambulatory Visit: Payer: Self-pay

## 2024-12-06 ENCOUNTER — Encounter: Payer: Self-pay | Admitting: Family Medicine

## 2024-12-06 ENCOUNTER — Telehealth: Admitting: Family Medicine

## 2024-12-06 DIAGNOSIS — J40 Bronchitis, not specified as acute or chronic: Secondary | ICD-10-CM

## 2024-12-06 MED ORDER — PREDNISONE 20 MG PO TABS: 40.0000 mg | ORAL_TABLET | Freq: Every day | ORAL | 0 refills | Status: AC

## 2024-12-06 MED ORDER — DOXYCYCLINE HYCLATE 100 MG PO TABS: 100.0000 mg | ORAL_TABLET | Freq: Two times a day (BID) | ORAL | 0 refills | Status: AC

## 2024-12-06 MED ORDER — HYDROCOD POLI-CHLORPHE POLI ER 10-8 MG/5ML PO SUER: 5.0000 mL | Freq: Two times a day (BID) | ORAL | 0 refills | Status: AC | PRN

## 2024-12-06 NOTE — Telephone Encounter (Signed)
 FYI Only or Action Required?: FYI only for provider: appointment scheduled on 12/06/24.  Patient was last seen in primary care on 12/03/2024 by Bernardo Fend, DO.  Called Nurse Triage reporting Bronchitis and Cough.  Symptoms began several days ago.  Interventions attempted: OTC medications: Mucinex and nasal spray and Prescription medications: Augmentin  and Tessalon .  Symptoms are: gradually worsening.  Triage Disposition: See HCP Within 4 Hours (Or PCP Triage)  Patient/caregiver understands and will follow disposition?: Yes                              Symptoms: worsening productive cough, wheezing, weakness related to lack of appetite, abdominal soreness related to coughing, dark/bronze-colored urine, night sweats Denies: difficulty breathing, chest pain, painful urination, abdominal pain, fever  Patient was evaluated for symptoms virtually on 12/03/24. Patient reported symptoms are unchanged, if not, worsening. Patient specifically requested to see PCP. Patient scheduled for virtual appointment with PCP this morning.   Reason for Disposition  Wheezing is present  Protocols used: Cough - Acute Productive-A-AH  Reason for Triage: Pt called saying she has been in for bronchitis like symptoms several times recently.  She has taken antibiotic and tessalon  pearls,  She is not getting any better,  She says now she is having a hard time going to the bathroom.

## 2024-12-06 NOTE — Progress Notes (Signed)
 "                    MyChart Video Visit    Virtual Visit via Video Note   This format is felt to be most appropriate for this patient at this time. Physical exam was limited by quality of the video and audio technology used for the visit.    Patient location: home Provider location: [facility] Persons involved in the visit: patient, provider  I discussed the limitations of evaluation and management by telemedicine and the availability of in person appointments. The patient expressed understanding and agreed to proceed.  Patient: Grace Wheeler   DOB: 01-Jul-1946   79 y.o. Female  MRN: [id number] Visit Date: 12/06/2024  Today's healthcare provider: Rockie Agent, MD   Chief Complaint  Patient presents with   Cough    X 3 weeks,  OTC medications: Mucinex and nasal spray and Prescription medications: Augmentin  and Tessalon . Worsening productive cough, wheezing, weakness related to lack of appetite, abdominal soreness related to coughing, dark/bronze-colored urine (within last 4 days), night sweats Has 1.5 days left of abx   Pre-visit Screening Tool Documentation    Checked with pt, unable to get vitals     Subjective:    HPI  Discussed the use of AI scribe software for clinical note transcription with the patient, who gave verbal consent to proceed.  History of Present Illness Grace Wheeler is a 79 year old female who presents with a three-week history of cough.  She has experienced a persistent, deep cough for three weeks, initially accompanied by discomfort in her left ear, neck, and jaw. The cough has been resistant to treatment with Augmentin , Mucinex, nasal spray, and Tessalon  [place].  Additional symptoms include wheezing, night sweats, weakness, lack of appetite, and abdominal soreness due to coughing. She notes a progressive darkening of her urine over the past three days. No chills, but she confirms night sweats, waking up with wet  pajamas and bed linens around her neck and shoulders.  No recent COVID or flu testing, but a COVID test last week was negative.  She mentions a history of receiving a pneumonia vaccine in October.   ROS       Objective:    There were no vitals taken for this visit.      Physical Exam Vitals reviewed.  Constitutional:      General: She is not in acute distress.    Appearance: Normal appearance. She is not ill-appearing.  Pulmonary:     Effort: Pulmonary effort is normal. No respiratory distress.     Comments: Non productive cough, no respiratory distress Neurological:     Mental Status: She is alert and oriented to person, place, and time.  Psychiatric:        Mood and Affect: Mood normal.        Behavior: Behavior normal.        Thought Content: Thought content normal.     Physical Exam       Assessment & Plan:    Problem List Items Addressed This Visit   None Visit Diagnoses       Bronchitis    -  Primary   Relevant Medications   doxycycline  (VIBRA -TABS) 100 MG tablet (Start on 12/09/2024)   chlorpheniramine-HYDROcodone (TUSSIONEX) 10-8 MG/5ML   predniSONE  (DELTASONE ) 20 MG tablet   Other Relevant Orders   DG Chest 2 View       Assessment and Plan Assessment & Plan  Lower respiratory tract infection (pneumonia vs bronchitis) Persistent dry cough for three weeks with wheezing, night sweats, and dark urine. Previous treatment with Augmentin  was ineffective. Differential includes pneumonia and post-infectious cough. No chills reported. Negative COVID test. No prednisone  prescribed previously. Discussed potential for post-infectious cough lasting up to eight weeks. Empirical treatment for pneumonia considered due to persistent symptoms and potential immune suppression from current medications. Discussed that the pneumonia vaccine covers many types of bacteria but not all, and that post-infectious cough can last up to eight weeks. - Ordered chest x-ray to  evaluate for pneumonia. - Prescribed doxycycline  100 mg twice daily for 7 days if symptoms persist after prednisone . - Prescribed prednisone  40 mg once daily for 5 days to reduce inflammation and bronchospasms. - Prescribed cough syrup with hydrocodone for cough suppression, to be used twice daily as needed. - Advised to start doxycycline  if no improvement after 3 days of prednisone .    Meds ordered this encounter  Medications   doxycycline  (VIBRA -TABS) 100 MG tablet    Sig: Take 1 tablet (100 mg total) by mouth 2 (two) times daily for 7 days.    Dispense:  14 tablet    Refill:  0   chlorpheniramine-HYDROcodone (TUSSIONEX) 10-8 MG/5ML    Sig: Take 5 mLs by mouth every 12 (twelve) hours as needed for cough.    Dispense:  115 mL    Refill:  0   predniSONE  (DELTASONE ) 20 MG tablet    Sig: Take 2 tablets (40 mg total) by mouth daily with breakfast for 5 days.    Dispense:  10 tablet    Refill:  0     No follow-ups on file.     I discussed the assessment and treatment plan with the patient. The patient was provided an opportunity to ask questions and all were answered. The patient agreed with the plan and demonstrated an understanding of the instructions.   The patient was advised to call back or seek an in-person evaluation if the symptoms worsen or if the condition fails to improve as anticipated.  I provided 25 minutes of non-face-to-face time during this encounter.  Rockie Agent, MD [facility]    "

## 2024-12-07 ENCOUNTER — Ambulatory Visit: Payer: Self-pay | Admitting: Family Medicine

## 2024-12-07 ENCOUNTER — Ambulatory Visit
Admission: RE | Admit: 2024-12-07 | Discharge: 2024-12-07 | Disposition: A | Source: Home / Self Care | Attending: Family Medicine | Admitting: Family Medicine

## 2024-12-07 ENCOUNTER — Ambulatory Visit
Admission: RE | Admit: 2024-12-07 | Discharge: 2024-12-07 | Disposition: A | Source: Ambulatory Visit | Attending: Family Medicine

## 2024-12-07 DIAGNOSIS — J40 Bronchitis, not specified as acute or chronic: Secondary | ICD-10-CM

## 2025-01-11 ENCOUNTER — Ambulatory Visit: Admitting: Internal Medicine

## 2025-01-19 ENCOUNTER — Ambulatory Visit: Admitting: Family Medicine

## 2025-01-21 ENCOUNTER — Ambulatory Visit: Admitting: Family Medicine

## 2025-02-01 ENCOUNTER — Ambulatory Visit: Admitting: Family Medicine

## 2025-06-14 ENCOUNTER — Encounter: Admitting: Dermatology
